# Patient Record
Sex: Female | Born: 1940 | Race: White | Hispanic: No | Smoking: Never smoker
Health system: Southern US, Community
[De-identification: ages and names within clinical notes are randomized; demographics above are authoritative.]

## PROBLEM LIST (undated history)

## (undated) DIAGNOSIS — M1990 Unspecified osteoarthritis, unspecified site: Secondary | ICD-10-CM

## (undated) DIAGNOSIS — E785 Hyperlipidemia, unspecified: Secondary | ICD-10-CM

## (undated) DIAGNOSIS — E236 Other disorders of pituitary gland: Secondary | ICD-10-CM

## (undated) DIAGNOSIS — I219 Acute myocardial infarction, unspecified: Secondary | ICD-10-CM

## (undated) DIAGNOSIS — I209 Angina pectoris, unspecified: Secondary | ICD-10-CM

## (undated) DIAGNOSIS — I10 Essential (primary) hypertension: Secondary | ICD-10-CM

## (undated) DIAGNOSIS — R0602 Shortness of breath: Secondary | ICD-10-CM

## (undated) DIAGNOSIS — I2510 Atherosclerotic heart disease of native coronary artery without angina pectoris: Secondary | ICD-10-CM

## (undated) DIAGNOSIS — M329 Systemic lupus erythematosus, unspecified: Secondary | ICD-10-CM

## (undated) DIAGNOSIS — K122 Cellulitis and abscess of mouth: Secondary | ICD-10-CM

## (undated) HISTORY — DX: Atherosclerotic heart disease of native coronary artery without angina pectoris: I25.10

## (undated) HISTORY — DX: Other disorders of pituitary gland: E23.6

## (undated) HISTORY — DX: Hyperlipidemia, unspecified: E78.5

## (undated) HISTORY — DX: Essential (primary) hypertension: I10

## (undated) HISTORY — DX: Systemic lupus erythematosus, unspecified: M32.9

---

## 1996-10-01 HISTORY — PX: CORONARY ARTERY BYPASS GRAFT: SHX141

## 1997-07-25 ENCOUNTER — Encounter: Admission: RE | Admit: 1997-07-25 | Discharge: 1997-07-25 | Payer: Self-pay | Admitting: Family Medicine

## 1997-08-01 ENCOUNTER — Ambulatory Visit (HOSPITAL_COMMUNITY): Admission: RE | Admit: 1997-08-01 | Discharge: 1997-08-01 | Payer: Self-pay | Admitting: Family Medicine

## 1997-09-15 ENCOUNTER — Ambulatory Visit (HOSPITAL_COMMUNITY): Admission: RE | Admit: 1997-09-15 | Discharge: 1997-09-15 | Payer: Self-pay | Admitting: Cardiology

## 1997-09-19 ENCOUNTER — Ambulatory Visit (HOSPITAL_COMMUNITY): Admission: RE | Admit: 1997-09-19 | Discharge: 1997-09-19 | Payer: Self-pay | Admitting: Cardiology

## 1997-10-03 ENCOUNTER — Encounter (HOSPITAL_COMMUNITY): Admission: RE | Admit: 1997-10-03 | Discharge: 1998-01-01 | Payer: Self-pay | Admitting: Cardiology

## 1998-01-17 ENCOUNTER — Encounter: Admission: RE | Admit: 1998-01-17 | Discharge: 1998-01-17 | Payer: Self-pay | Admitting: Family Medicine

## 1998-08-02 ENCOUNTER — Encounter: Admission: RE | Admit: 1998-08-02 | Discharge: 1998-08-02 | Payer: Self-pay | Admitting: Family Medicine

## 1998-08-16 ENCOUNTER — Ambulatory Visit (HOSPITAL_COMMUNITY): Admission: RE | Admit: 1998-08-16 | Discharge: 1998-08-16 | Payer: Self-pay | Admitting: Family Medicine

## 1998-08-26 ENCOUNTER — Encounter: Payer: Self-pay | Admitting: Family Medicine

## 1998-08-26 ENCOUNTER — Ambulatory Visit (HOSPITAL_COMMUNITY): Admission: RE | Admit: 1998-08-26 | Discharge: 1998-08-26 | Payer: Self-pay | Admitting: Family Medicine

## 1999-02-12 ENCOUNTER — Encounter: Admission: RE | Admit: 1999-02-12 | Discharge: 1999-02-12 | Payer: Self-pay | Admitting: Sports Medicine

## 1999-03-01 ENCOUNTER — Encounter: Admission: RE | Admit: 1999-03-01 | Discharge: 1999-03-01 | Payer: Self-pay | Admitting: Family Medicine

## 1999-04-11 ENCOUNTER — Encounter: Admission: RE | Admit: 1999-04-11 | Discharge: 1999-04-11 | Payer: Self-pay | Admitting: Family Medicine

## 1999-06-25 ENCOUNTER — Inpatient Hospital Stay (HOSPITAL_COMMUNITY): Admission: EM | Admit: 1999-06-25 | Discharge: 1999-06-27 | Payer: Self-pay | Admitting: Emergency Medicine

## 1999-06-25 ENCOUNTER — Encounter: Payer: Self-pay | Admitting: Emergency Medicine

## 1999-08-02 ENCOUNTER — Encounter: Payer: Self-pay | Admitting: Obstetrics and Gynecology

## 1999-08-02 ENCOUNTER — Encounter: Admission: RE | Admit: 1999-08-02 | Discharge: 1999-08-02 | Payer: Self-pay | Admitting: Obstetrics and Gynecology

## 1999-08-15 ENCOUNTER — Encounter: Admission: RE | Admit: 1999-08-15 | Discharge: 1999-08-15 | Payer: Self-pay | Admitting: Family Medicine

## 1999-09-17 ENCOUNTER — Encounter: Admission: RE | Admit: 1999-09-17 | Discharge: 1999-09-17 | Payer: Self-pay | Admitting: Family Medicine

## 1999-11-21 ENCOUNTER — Encounter: Admission: RE | Admit: 1999-11-21 | Discharge: 1999-11-21 | Payer: Self-pay | Admitting: Family Medicine

## 2000-09-24 ENCOUNTER — Encounter: Admission: RE | Admit: 2000-09-24 | Discharge: 2000-09-24 | Payer: Self-pay | Admitting: Family Medicine

## 2001-03-03 ENCOUNTER — Encounter (INDEPENDENT_AMBULATORY_CARE_PROVIDER_SITE_OTHER): Payer: Self-pay | Admitting: *Deleted

## 2001-03-03 LAB — CONVERTED CEMR LAB

## 2001-06-29 ENCOUNTER — Encounter: Admission: RE | Admit: 2001-06-29 | Discharge: 2001-06-29 | Payer: Self-pay | Admitting: Family Medicine

## 2001-09-28 ENCOUNTER — Encounter: Admission: RE | Admit: 2001-09-28 | Discharge: 2001-09-28 | Payer: Self-pay | Admitting: Family Medicine

## 2001-09-29 ENCOUNTER — Encounter: Admission: RE | Admit: 2001-09-29 | Discharge: 2001-09-29 | Payer: Self-pay | Admitting: Family Medicine

## 2001-10-04 ENCOUNTER — Encounter: Admission: RE | Admit: 2001-10-04 | Discharge: 2001-10-04 | Payer: Self-pay | Admitting: Family Medicine

## 2002-01-17 ENCOUNTER — Encounter (INDEPENDENT_AMBULATORY_CARE_PROVIDER_SITE_OTHER): Payer: Self-pay | Admitting: *Deleted

## 2002-01-17 ENCOUNTER — Ambulatory Visit (HOSPITAL_COMMUNITY): Admission: RE | Admit: 2002-01-17 | Discharge: 2002-01-17 | Payer: Self-pay | Admitting: Gastroenterology

## 2002-05-29 ENCOUNTER — Inpatient Hospital Stay (HOSPITAL_COMMUNITY): Admission: EM | Admit: 2002-05-29 | Discharge: 2002-05-31 | Payer: Self-pay

## 2002-05-29 ENCOUNTER — Encounter: Payer: Self-pay | Admitting: Emergency Medicine

## 2002-06-06 ENCOUNTER — Encounter: Admission: RE | Admit: 2002-06-06 | Discharge: 2002-09-04 | Payer: Self-pay | Admitting: Cardiology

## 2002-06-20 ENCOUNTER — Encounter: Payer: Self-pay | Admitting: Cardiology

## 2002-06-20 ENCOUNTER — Inpatient Hospital Stay (HOSPITAL_COMMUNITY): Admission: AD | Admit: 2002-06-20 | Discharge: 2002-06-21 | Payer: Self-pay | Admitting: Cardiology

## 2002-09-06 ENCOUNTER — Encounter: Admission: RE | Admit: 2002-09-06 | Discharge: 2002-12-05 | Payer: Self-pay | Admitting: Cardiology

## 2002-11-18 ENCOUNTER — Ambulatory Visit (HOSPITAL_COMMUNITY): Admission: RE | Admit: 2002-11-18 | Discharge: 2002-11-19 | Payer: Self-pay | Admitting: Cardiology

## 2002-11-18 ENCOUNTER — Encounter: Payer: Self-pay | Admitting: Cardiology

## 2003-02-20 ENCOUNTER — Ambulatory Visit (HOSPITAL_COMMUNITY): Admission: RE | Admit: 2003-02-20 | Discharge: 2003-02-20 | Payer: Self-pay | Admitting: Cardiology

## 2004-05-30 ENCOUNTER — Ambulatory Visit: Payer: Self-pay | Admitting: Family Medicine

## 2004-05-31 ENCOUNTER — Ambulatory Visit: Payer: Self-pay | Admitting: Sports Medicine

## 2004-06-05 ENCOUNTER — Ambulatory Visit (HOSPITAL_COMMUNITY): Admission: RE | Admit: 2004-06-05 | Discharge: 2004-06-05 | Payer: Self-pay | Admitting: Cardiology

## 2004-06-27 ENCOUNTER — Ambulatory Visit: Payer: Self-pay

## 2004-09-26 ENCOUNTER — Ambulatory Visit: Payer: Self-pay | Admitting: Family Medicine

## 2005-01-28 ENCOUNTER — Ambulatory Visit: Payer: Self-pay | Admitting: Family Medicine

## 2005-05-15 ENCOUNTER — Ambulatory Visit (HOSPITAL_COMMUNITY): Admission: RE | Admit: 2005-05-15 | Discharge: 2005-05-15 | Payer: Self-pay | Admitting: Family Medicine

## 2005-05-19 ENCOUNTER — Encounter: Admission: RE | Admit: 2005-05-19 | Discharge: 2005-08-17 | Payer: Self-pay | Admitting: Family Medicine

## 2005-06-30 ENCOUNTER — Ambulatory Visit: Payer: Self-pay | Admitting: Internal Medicine

## 2005-06-30 ENCOUNTER — Emergency Department (HOSPITAL_COMMUNITY): Admission: EM | Admit: 2005-06-30 | Discharge: 2005-06-30 | Payer: Self-pay | Admitting: Emergency Medicine

## 2005-06-30 ENCOUNTER — Ambulatory Visit: Payer: Self-pay | Admitting: Family Medicine

## 2005-06-30 ENCOUNTER — Inpatient Hospital Stay (HOSPITAL_COMMUNITY): Admission: AD | Admit: 2005-06-30 | Discharge: 2005-07-03 | Payer: Self-pay | Admitting: Oral Surgery

## 2005-07-01 ENCOUNTER — Encounter: Payer: Self-pay | Admitting: Vascular Surgery

## 2006-03-03 DIAGNOSIS — K122 Cellulitis and abscess of mouth: Secondary | ICD-10-CM

## 2006-03-03 HISTORY — DX: Cellulitis and abscess of mouth: K12.2

## 2006-04-30 DIAGNOSIS — I2510 Atherosclerotic heart disease of native coronary artery without angina pectoris: Secondary | ICD-10-CM | POA: Insufficient documentation

## 2006-04-30 DIAGNOSIS — F329 Major depressive disorder, single episode, unspecified: Secondary | ICD-10-CM

## 2006-04-30 DIAGNOSIS — M329 Systemic lupus erythematosus, unspecified: Secondary | ICD-10-CM

## 2006-04-30 DIAGNOSIS — E119 Type 2 diabetes mellitus without complications: Secondary | ICD-10-CM

## 2006-05-01 ENCOUNTER — Encounter (INDEPENDENT_AMBULATORY_CARE_PROVIDER_SITE_OTHER): Payer: Self-pay | Admitting: *Deleted

## 2006-07-01 ENCOUNTER — Ambulatory Visit (HOSPITAL_COMMUNITY): Admission: RE | Admit: 2006-07-01 | Discharge: 2006-07-01 | Payer: Self-pay | Admitting: Cardiovascular Disease

## 2006-08-06 ENCOUNTER — Ambulatory Visit (HOSPITAL_COMMUNITY): Admission: RE | Admit: 2006-08-06 | Discharge: 2006-08-06 | Payer: Self-pay | Admitting: Obstetrics and Gynecology

## 2006-08-20 ENCOUNTER — Ambulatory Visit: Payer: Self-pay | Admitting: Family Medicine

## 2006-08-20 DIAGNOSIS — E785 Hyperlipidemia, unspecified: Secondary | ICD-10-CM | POA: Insufficient documentation

## 2006-08-20 DIAGNOSIS — I10 Essential (primary) hypertension: Secondary | ICD-10-CM

## 2006-08-20 LAB — CONVERTED CEMR LAB
ALT: 18 units/L (ref 0–35)
AST: 18 units/L (ref 0–37)
Albumin: 4.8 g/dL (ref 3.5–5.2)
Alkaline Phosphatase: 69 units/L (ref 39–117)
BUN: 17 mg/dL (ref 6–23)
Cholesterol: 131 mg/dL (ref 0–200)
Creatinine, Ser: 0.64 mg/dL (ref 0.40–1.20)
HCT: 42.7 %
HDL: 44 mg/dL (ref >39)
MCV: 89.8 fL
Potassium: 4 meq/L (ref 3.5–5.3)
Sodium: 143 meq/L (ref 135–145)
Total Bilirubin: 1.6 mg/dL — ABNORMAL HIGH (ref 0.3–1.2)
Triglycerides: 108 mg/dL (ref <150)

## 2006-08-25 ENCOUNTER — Encounter: Payer: Self-pay | Admitting: Family Medicine

## 2006-09-19 ENCOUNTER — Emergency Department (HOSPITAL_COMMUNITY): Admission: EM | Admit: 2006-09-19 | Discharge: 2006-09-20 | Payer: Self-pay | Admitting: Emergency Medicine

## 2006-11-09 ENCOUNTER — Emergency Department (HOSPITAL_COMMUNITY): Admission: EM | Admit: 2006-11-09 | Discharge: 2006-11-09 | Payer: Self-pay | Admitting: Emergency Medicine

## 2007-04-08 ENCOUNTER — Ambulatory Visit: Payer: Self-pay | Admitting: Family Medicine

## 2007-04-08 LAB — CONVERTED CEMR LAB
ALT: 22 units/L (ref 0–35)
AST: 18 units/L (ref 0–37)
Alkaline Phosphatase: 79 units/L (ref 39–117)
CO2: 22 meq/L (ref 19–32)
Chloride: 104 meq/L (ref 96–112)
Creatinine, Ser: 0.6 mg/dL (ref 0.40–1.20)
Glucose, Bld: 116 mg/dL — ABNORMAL HIGH (ref 70–99)
HCT: 44.4 % (ref 36.0–46.0)
Hemoglobin: 14.9 g/dL (ref 12.0–15.0)
Hgb A1c MFr Bld: 6.6 %
MCHC: 33.6 g/dL (ref 30.0–36.0)
MCV: 89.2 fL (ref 78.0–100.0)
Platelets: 262 10*3/uL (ref 150–400)
Total Bilirubin: 1.9 mg/dL — ABNORMAL HIGH (ref 0.3–1.2)

## 2007-04-09 ENCOUNTER — Encounter: Payer: Self-pay | Admitting: Family Medicine

## 2007-08-20 ENCOUNTER — Encounter: Payer: Self-pay | Admitting: Family Medicine

## 2007-10-14 ENCOUNTER — Ambulatory Visit: Payer: Self-pay | Admitting: Family Medicine

## 2008-06-07 ENCOUNTER — Ambulatory Visit (HOSPITAL_COMMUNITY): Admission: RE | Admit: 2008-06-07 | Discharge: 2008-06-07 | Payer: Self-pay | Admitting: Obstetrics & Gynecology

## 2008-06-07 ENCOUNTER — Ambulatory Visit: Payer: Self-pay | Admitting: Family Medicine

## 2008-06-07 ENCOUNTER — Encounter: Payer: Self-pay | Admitting: Family Medicine

## 2008-06-07 LAB — CONVERTED CEMR LAB
ALT: 20 units/L (ref 0–35)
Alkaline Phosphatase: 74 units/L (ref 39–117)
Cholesterol: 125 mg/dL (ref 0–200)
Glucose, Bld: 133 mg/dL — ABNORMAL HIGH (ref 70–99)
HDL: 48 mg/dL (ref >39)
LDL Cholesterol: 57 mg/dL (ref 0–99)
Sodium: 145 meq/L (ref 135–145)
Total CHOL/HDL Ratio: 2.6

## 2008-11-08 ENCOUNTER — Encounter: Payer: Self-pay | Admitting: Family Medicine

## 2008-11-08 LAB — CONVERTED CEMR LAB
AST: 29 units/L
Alkaline Phosphatase: 90 units/L

## 2008-11-29 ENCOUNTER — Encounter: Payer: Self-pay | Admitting: Family Medicine

## 2008-11-30 ENCOUNTER — Ambulatory Visit: Payer: Self-pay | Admitting: Family Medicine

## 2008-11-30 LAB — CONVERTED CEMR LAB: Hgb A1c MFr Bld: 6.4 %

## 2008-12-04 ENCOUNTER — Encounter: Admission: RE | Admit: 2008-12-04 | Discharge: 2008-12-04 | Payer: Self-pay | Admitting: Family Medicine

## 2008-12-04 ENCOUNTER — Encounter: Payer: Self-pay | Admitting: Family Medicine

## 2009-06-07 ENCOUNTER — Telehealth: Payer: Self-pay | Admitting: Family Medicine

## 2009-07-31 ENCOUNTER — Ambulatory Visit (HOSPITAL_COMMUNITY): Admission: RE | Admit: 2009-07-31 | Discharge: 2009-07-31 | Payer: Self-pay | Admitting: Obstetrics & Gynecology

## 2009-08-15 ENCOUNTER — Ambulatory Visit: Payer: Self-pay | Admitting: Family Medicine

## 2009-10-22 ENCOUNTER — Encounter: Payer: Self-pay | Admitting: Family Medicine

## 2009-12-17 ENCOUNTER — Ambulatory Visit: Payer: Self-pay | Admitting: Cardiology

## 2009-12-17 ENCOUNTER — Encounter: Admission: RE | Admit: 2009-12-17 | Discharge: 2009-12-17 | Payer: Self-pay | Admitting: Cardiology

## 2009-12-18 ENCOUNTER — Ambulatory Visit: Payer: Self-pay | Admitting: Cardiology

## 2009-12-18 ENCOUNTER — Inpatient Hospital Stay (HOSPITAL_COMMUNITY): Admission: RE | Admit: 2009-12-18 | Discharge: 2009-12-20 | Payer: Self-pay | Admitting: Cardiology

## 2009-12-21 ENCOUNTER — Observation Stay (HOSPITAL_COMMUNITY): Admission: EM | Admit: 2009-12-21 | Discharge: 2009-12-22 | Payer: Self-pay | Admitting: Emergency Medicine

## 2009-12-21 ENCOUNTER — Encounter: Payer: Self-pay | Admitting: Cardiology

## 2009-12-21 ENCOUNTER — Ambulatory Visit: Payer: Self-pay | Admitting: Cardiovascular Disease

## 2009-12-21 ENCOUNTER — Telehealth: Payer: Self-pay | Admitting: Cardiovascular Disease

## 2010-01-07 ENCOUNTER — Ambulatory Visit: Payer: Self-pay | Admitting: Cardiology

## 2010-01-30 ENCOUNTER — Encounter (INDEPENDENT_AMBULATORY_CARE_PROVIDER_SITE_OTHER): Payer: Self-pay | Admitting: *Deleted

## 2010-04-02 NOTE — Assessment & Plan Note (Signed)
Summary: not feeling well,df   History of Present Illness: 70 yo female here because 1-2 days a week she feels low energy.  No interest in doing things those days.  Retired 1 year ago.  Has been feeling this way about 1 year.    Denies sadness.   Planss to go on HCG diet - has done in the past, and had blood work done for that.  Showed elevated thyroid antibodies and hbgA1c of 6.8.   Trouble sleeping, trouble concentrating, low energy, sits around house and watches TV on her bad days.  Takes  Paxil, unsure if this helps.  Denies SI/HI.  Has supportive BF, no violence.  He is 17 years younger than she is, and she is afraid she won't be "able to keep up with him".  Significant stressors: Pt raised her grandsons (now 24 and 34) because her daughter was a drug user.  Daughter is now dying, and pt has been in touch with her again and other family members are not supportive of this. 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  Pt lives part time in [place].  Not really established in primary care there, but did have a doctor who put her on the hcg diet.      Not on meds for diabetes.  Was told she had borderline diabetes 3 years ago, recently told full blown.   Feels unsure about what to do for diabetes. Interested in meeting with diabetic educator, but has to do so within 2 days because she has to go back to [place].  Current Medications (verified): 1)  Metoprolol Tartrate 50 Mg Tabs (Metoprolol Tartrate) .... Take 1/2 Tab By Mouth Two Times A Day 2)  Norvasc 10 Mg Tabs (Amlodipine Besylate) .Marland Kitchen.. 1 Daily 3)  Zetia 10 Mg Tabs (Ezetimibe) .Marland Kitchen.. 1 Once Daily 4)  Simvastatin 40 Mg Tabs (Simvastatin) .... Take One Tablet At Bedtime 5)  Plavix 75 Mg Tabs (Clopidogrel Bisulfate) .... Take 1 Tab By Mouth Daily 6)  Paxil 20 Mg Tabs (Paroxetine Hcl) .Marland Kitchen.. 1 By Mouth Once Daily 7)  Multivitamins   Tabs (Multiple Vitamin) 8)  Fish Oil 1000 Mg Caps (Omega-3 Fatty Acids) .Marland Kitchen.. 1 By Mouth Daily 9)  Aspirin 325 Mg Tabs (Aspirin) .Marland Kitchen.. 1 By Mouth Daily 10)  Caltrate 600+d Plus 600-400 Mg-Unit Tabs (Calcium Carbonate-Vit D-Min) 11)  Glucosamine 500 Mg Caps (Glucosamine Sulfate) 12)  Paxil 40 Mg Tabs (Paroxetine Hcl) .Marland Kitchen.. 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  Her daughter (their mother) is dying.   Has boyfriend.  No DV Sister Francella Solian  Review of Systems       see HPI  Physical Exam  General:  Obese, subdued.  No acute distress.  Vitals noted. Psych:  Flat affect.  Normal grooming and dress.  Normal TC/TP without FOI or LOA.  Non labile.     Impression & Recommendations:  Problem # 1:  DEPRESSIVE DISORDER, NOS (ICD-311)  Pt denies SI/HI.  SItuation complicated by significant stress and life changes.  Pt would likely benefit from cognitive behavioral therapy, but she not interested in trying to get established here because she spends  most of her time in [place].  Will increase paroxetine to 40 daily.   Pt ed doen on meds and depressive d/o.  She contracts for safety. Her updated medication list for this problem includes:    Paxil 20 Mg Tabs (Paroxetine hcl) .Marland Kitchen... 1 by mouth once daily    Paxil 40 Mg Tabs (Paroxetine hcl) .Marland Kitchen... 1 daily by mouth for depression  Orders: [facility]- Est Level  3 ([address])  Problem # 2:  DIABETES MELLITUS II, UNCOMPLICATED (ICD-250.00) Pt could benefit from diabetic education, but unable to see someone here because of time constraints.  Strongly encouaraged pt to get established with primary care in [place] asap to have someone managing diabetes.  Brief education done today.   Her updated medication list for this  problem includes:    Aspirin 325 Mg Tabs (Aspirin) .Marland Kitchen... 1 by mouth daily  Orders: Glucose-[facility] (60454-09811) [facility]- Est Level  3 (91478)  Complete Medication List: 1)  Metoprolol Tartrate 50 Mg Tabs (Metoprolol tartrate) .... Take 1/2 tab by mouth two times a day 2)  Norvasc 10 Mg Tabs (Amlodipine besylate) .Marland Kitchen.. 1 daily 3)  Zetia 10 Mg Tabs (Ezetimibe) .Marland Kitchen.. 1 once daily 4)  Simvastatin 40 Mg Tabs (Simvastatin) .... Take one tablet at bedtime 5)  Plavix 75 Mg Tabs (Clopidogrel bisulfate) .... Take 1 tab by mouth daily 6)  Paxil 20 Mg Tabs (Paroxetine hcl) .Marland Kitchen.. 1 by mouth once daily 7)  Multivitamins Tabs (Multiple vitamin) 8)  Fish Oil 1000 Mg Caps (Omega-3 fatty acids) .Marland Kitchen.. 1 by mouth daily 9)  Aspirin 325 Mg Tabs (Aspirin) .Marland Kitchen.. 1 by mouth daily 10)  Caltrate 600+d Plus 600-400 Mg-unit Tabs (Calcium carbonate-vit d-min) 11)  Glucosamine 500 Mg Caps (Glucosamine sulfate) 12)  Paxil 40 Mg Tabs (Paroxetine hcl) .Marland Kitchen.. 1 daily by mouth for depression  Patient Instructions: 1)  Take 2 of the 20 mg Paxil tablets that you have at home until you run out.  Then, you can start the new prescription of 40 mg a day. 2)  Try to get hooked in with a therapist in [place].  I would suggest cognitive behavioral therapy. 3)  You can try to meet with the diabetic educator before you go back, or you can meet with one in [place].   4)  Please come see me or Dr. Deirdre Priest when you are back in town, but you should probably get established with a primary care doctor in [place] if you plan to stay there.   Prescriptions: PAXIL 40 MG TABS (PAROXETINE HCL) 1 daily by mouth for depression  #90 x 0   Entered and Authorized by:   Kynadie Yaun [place] MD   Signed by:   Lazer Wollard [place] MD on 08/15/2009   Method used:   Electronically to        [facility] [address] (retail)       [phone]       [place], [place]  [address]       Ph: [phone]       Fax: [phone]   RxID:    [id number]

## 2010-04-02 NOTE — Miscellaneous (Signed)
Summary: med record request  Clinical Lists Changes  Rec'd med record request from [facility] center faxed 09/24/09 Marily Memos  January 30, 2010 3:16 PM

## 2010-04-02 NOTE — Progress Notes (Signed)
Summary: phn msg  Phone Note Call from Patient Call back at [phone] or [phone]   Caller: Patient Reason for Call: Talk to Nurse Summary of Call: pt wants to speak with RN about going on the HCG diet - taking a low dose shot of HCG for 6 weeks - it is a medical doctor that will be doing this but needs an OK from Dr C Initial call taken by: Knox Royalty,  June 07, 2009 12:10 PM  Follow-up for Phone Call         lm on other number that I was sending this to pcp & will call her with his response Follow-up by: Golden Circle RN,  June 07, 2009 12:13 PM  Additional Follow-up for Phone Call Additional follow up Details #1::        pt is calling again Additional Follow-up by: De Nurse,  June 08, 2009 1:40 PM    Additional Follow-up for Phone Call Additional follow up Details #2::    pt will check with doctor and get info to send to Dr C Follow-up by: De Nurse,  June 08, 2009 3:20 PM  Additional Follow-up for Phone Call Additional follow up Details #3:: Details for Additional Follow-up Action Taken: Angelique Blonder spoke with her and she will contact us with information that I will review  Additional Follow-up by: Pearlean Brownie MD,  June 08, 2009 5:05 PM

## 2010-04-02 NOTE — Letter (Signed)
Summary: ER Notification  Architectural technologist, Main Office  [address]   [place], [place] [address]   Phone: [phone]  Fax: [phone]    December 21, 2009 12:47 PM  Casey [name]  The above referenced patient has been advised to report directly to the Emergency Room. Please see below for more information:  Dx: ___chest pain_________     Private Vehicle  ______X_________ or EMS:  ________________   Orders:  Yes ______ or No  ___X____   Notify upon arrival:    Trish [phone]       Or _________________   Thank you,    [facility] Staff

## 2010-04-02 NOTE — Consult Note (Signed)
Summary: [facility]  [facility]   Imported By: Clydell Hakim 10/24/2009 10:54:26  _____________________________________________________________________  External Attachment:    Type:   Image     Comment:   External Document

## 2010-04-02 NOTE — Progress Notes (Signed)
Summary: chest pain no sob  Phone Note Call from Patient   Caller: Patient Reason for Call: Talk to Nurse Summary of Call: pt has chest pain. for . Initial call taken by: Roe Coombs,  December 21, 2009 12:35 PM  Follow-up for Phone Call        pt states about ago she had a bowel movement and strained since that time she has been having chest pain, she describes it as a dull constant pain, she states its the same pain she had before having stent placed this week.  No sob, no n/v, non radiating.  She has been resting and it is not getting better, advised to go to ER she is agreeable, she states someone is w/her to driver her, Rosann Auerbach is aware Meredith Staggers, RN  December 21, 2009 12:47 PM

## 2010-05-15 LAB — CBC
HCT: 38.8 % (ref 36.0–46.0)
HCT: 40 % (ref 36.0–46.0)
HCT: 41.3 % (ref 36.0–46.0)
MCHC: 33.7 g/dL (ref 30.0–36.0)
MCHC: 34.3 g/dL (ref 30.0–36.0)
MCV: 89.3 fL (ref 78.0–100.0)
MCV: 89.6 fL (ref 78.0–100.0)
MCV: 89.6 fL (ref 78.0–100.0)
Platelets: 208 10*3/uL (ref 150–400)
RDW: 13.1 % (ref 11.5–15.5)
RDW: 13.2 % (ref 11.5–15.5)
WBC: 6.9 10*3/uL (ref 4.0–10.5)
WBC: 6.9 10*3/uL (ref 4.0–10.5)
WBC: 9.4 10*3/uL (ref 4.0–10.5)

## 2010-05-15 LAB — POCT I-STAT, CHEM 8
BUN: 9 mg/dL (ref 6–23)
Glucose, Bld: 181 mg/dL — ABNORMAL HIGH (ref 70–99)
Hemoglobin: 14.3 g/dL (ref 12.0–15.0)
Potassium: 3.9 mEq/L (ref 3.5–5.1)
Sodium: 139 mEq/L (ref 135–145)

## 2010-05-15 LAB — BASIC METABOLIC PANEL
BUN: 8 mg/dL (ref 6–23)
CO2: 25 mEq/L (ref 19–32)
Calcium: 8.5 mg/dL (ref 8.4–10.5)
Creatinine, Ser: 0.61 mg/dL (ref 0.4–1.2)
GFR calc Af Amer: >60 mL/min (ref >60)
GFR calc non Af Amer: >60 mL/min (ref >60)
Potassium: 3.6 mEq/L (ref 3.5–5.1)
Sodium: 137 mEq/L (ref 135–145)

## 2010-05-15 LAB — CARDIAC PANEL(CRET KIN+CKTOT+MB+TROPI)
CK, MB: 0.5 ng/mL (ref 0.3–4.0)
Relative Index: INVALID (ref 0.0–2.5)
Relative Index: INVALID (ref 0.0–2.5)
Total CK: 39 U/L (ref 7–177)
Total CK: 44 U/L (ref 7–177)

## 2010-05-15 LAB — POCT CARDIAC MARKERS
CKMB, poc: <1 ng/mL — ABNORMAL LOW (ref 1.0–8.0)
Myoglobin, poc: 26.9 ng/mL (ref 12–200)

## 2010-05-15 LAB — DIFFERENTIAL
Basophils Absolute: 0 10*3/uL (ref 0.0–0.1)
Basophils Relative: 0 % (ref 0–1)
Eosinophils Relative: 1 % (ref 0–5)
Monocytes Absolute: 0.9 10*3/uL (ref 0.1–1.0)
Neutro Abs: 4.2 10*3/uL (ref 1.7–7.7)

## 2010-05-15 LAB — GLUCOSE, CAPILLARY
Glucose-Capillary: 131 mg/dL — ABNORMAL HIGH (ref 70–99)
Glucose-Capillary: 150 mg/dL — ABNORMAL HIGH (ref 70–99)
Glucose-Capillary: 151 mg/dL — ABNORMAL HIGH (ref 70–99)

## 2010-07-16 NOTE — Discharge Summary (Signed)
Casey [name], WAXMAN NO.:  [id number]   MEDICAL RECORD NO.:  [id number]          PATIENT TYPE:  OUT   LOCATION:  [facility]                          FACILITY:  [facility]   PHYSICIAN:  Grant Ruts., D.D.S.DATE OF BIRTH:  01-17-1941   DATE OF ADMISSION:  08/06/2006  DATE OF DISCHARGE:  08/06/2006                               DISCHARGE SUMMARY   DISCHARGE NOTE AND SUMMARY:  This 70 year old female was admitted to  [facility] on June 03, 2005 with primary diagnosis of left  submandibular space abscess and cellulitis secondary to dental implant  surgery, and arteriosclerotic cardiovascular disease, hypertension, and  diabetes.  On the day of admission, she was started on intravenous  antibiotics with Levaquin 750 mg daily.  Consultation was received from  Infectious Disease, and medical consultation from Dr. Tawanna Cooler McDiarmid.  The left submandibular space had been drained and a Penrose drain had  been placed extraorally, and cultures were taken for routine cultures  and sensitivity.   HOSPITAL COURSE:  It was noted that CT scan revealed that she had a left  submandibular sialoadenitis, and blockage of the left submandibular  gland, that was the source of the infection, and not the dental  implants.  Consultation was received from ENT, and confirmed the  sialoadenitis and secondary submandibular abscess.  She initially was  changed to Zosyn, and responded well to the antibiotic.  She initially  was placed on vancomycin, but this was discontinued.  Her abscess  responded well to antibiotic treatment, and she was changed to oral  Augmentin 875 mg twice daily.  Her mouth swelling and drainage has  stopped, and she was taking food well.  After control of the abscess,  she was discharged to follow as an outpatient for her dental implants.  She will be followed by her family physician for her cardiovascular  disease and hypertension.  She will see Dr. Pollyann Kennedy for followup of the  stone in the left submandibular gland.   FINAL DIAGNOSES:  1. Left submandibular space abscess and cellulitis secondary to      blockage of left submandibular gland with submandibular stone.  2. Chronic cardiovascular disease.  3. Hypertension.  4. Diabetes.      Grant Ruts., D.D.S.  Electronically Signed     WB/MEDQ  D:  09/15/2006  T:  09/15/2006  Job:  [id number]

## 2010-07-19 NOTE — Cardiovascular Report (Signed)
NAMEVALINDA, [name] NO.:  [id number]   MEDICAL RECORD NO.:  [id number]          PATIENT TYPE:  OIB   LOCATION:  2866                         FACILITY:  [facility]   PHYSICIAN:  Colleen Can. Deborah Chalk, M.D.DATE OF BIRTH:  08-16-1940   DATE OF PROCEDURE:  06/05/2004  DATE OF DISCHARGE:                              CARDIAC CATHETERIZATION   HISTORY:  [name] is a 70 year old female with previous coronary artery  bypass grafting.  She is referred for catheterization because of recurrent  chest pain.   PROCEDURES:  Left heart catheterization with selective coronary angiography,  left ventricular angiography, saphenous vein graft angiography x 1 and  angiography of left internal mammary artery.   TYPE AND SITE OF ENTRY:  Percutaneous right femoral artery with Angio-Seal.   CATHETERS:  6 French 4 curved Judkins right and left coronary catheters, 6  French pigtail ventriculographic catheter and left internal mammary graft  catheter.   CONTRAST:  Omnipaque.   MEDICATIONS GIVEN PRIOR TO PROCEDURE:  Valium 10 mg p.o.   MEDICATIONS GIVEN DURING PROCEDURE:  Versed 5 mg IV.   COMMENTS:  The patient tolerated the procedure well.   HEMODYNAMIC DATA:  The aortic pressure was 115/61.  LV was 139/4-11.  There  was no aortic valve gradient noted on pullback.   ANGIOGRAPHIC DATA:  1.  The left main coronary artery has a distal 60% narrowing.  2.  The left anterior descending has irregularities and narrowing proximally      that continue out of the left main stenosis.  Overall does not appear to      be significantly greater than a moderate narrowing proximal in the LAD.      Flow into the branches is satisfactory.  There is a left internal      mammary artery graft providing bidirectional flow distally in the left      anterior descending.  3.  Left circumflex:  The left circumflex has stenosis proximally, but it      has bidirectional flow.  There is excellent flow  distally from a      saphenous vein graft.  4.  Intermediate coronary artery:  The intermediate coronary artery is      basically normal.  5.  Right coronary artery:  The right coronary artery has patent stents in      its proximal segment.  One stented area has a 20-30% narrowing.  There      is 50% ostial posterior descending artery narrowing.   The saphenous vein graft to the obtuse marginal has 20-40% narrowing in the  mid portion of the vessel.  The saphenous vein graft does have early  atherosclerotic disease.   The left internal mammary artery graft to the LAD is patent and is  relatively small, but does have bidirectional flow distally.   The left ventriculogram was performed in the RAO position.  Overall cardiac  size and silhouette are normal.  The global ejection fraction is 60%.  Regional wall motion is normal.   OVERALL IMPRESSION:  1.  Essentially normal left ventricular function.  2.  Patent stents  in the right coronary artery.  3.  Distal left main coronary artery stenosis.  4.  Patent saphenous vein graft to the left circumflex system with a patent      left internal mammary artery graft to the left anterior descending.   DISCUSSION:  These findings suggest that Casey [name] is stable at this point in  time.  We will again encourage her to continue to modify cardiac risk  factors.      SNT/MEDQ  D:  06/05/2004  T:  06/05/2004  Job:  [id number]

## 2010-07-19 NOTE — Cardiovascular Report (Signed)
[name], [name] NO.:  [id number]   MEDICAL RECORD NO.:  [id number]          PATIENT TYPE:  OIB   LOCATION:  2858                         FACILITY:  [facility]   PHYSICIAN:  Vesta Mixer, M.D. DATE OF BIRTH:  Sep 09, 1940   DATE OF PROCEDURE:  07/01/2006  DATE OF DISCHARGE:                            CARDIAC CATHETERIZATION   Casey [name] is a 70 year old female with a history of coronary  artery disease and coronary artery bypass grafting.  She is referred for  heart catheterization after having significant episodes of chest pain  and chest discomfort.   PROCEDURES PERFORMED:  Left heart catheterization with coronary  angiography.   The right femoral artery was easily cannulated using modified Seldinger  technique.   HEMODYNAMIC RESULTS:  LV pressure is 121/11 with an aortic pressure of  117/75.   ANGIOGRAPHY:  The left main has minor irregularities in the proximal  segment.  There is a 50-60% stenosis in the distal aspect of the left  main.   The left anterior descending artery in the proximal segment has moderate  irregularities of about 30%.  The mid LAD has some minor luminal  irregularities.  The distal LAD is fairly small and appears to have  about a 60-70% stenosis after giving off a second diagonal vessel.  The  diagonal vessels are moderate in size and have only minor luminal  irregularities.  There is competitive flow visible from the IMA.   The left circumflex artery has a long proximal stenosis of 70-80% prior  to giving off the first OM.  The remainder of the circumflex artery has  minor luminal irregularities.  The first obtuse marginal artery can be  seen filling with competitive flow from the graft.   The right coronary artery is large and dominant.  The proximal stent is  normal.  This is followed by a mid 30% stenosis.  There is also a distal  30-40% stenosis just prior to the distal stent.   The posterior descending artery has  a 30-40% stenosis at the takeoff.   The saphenous vein graft to the obtuse marginal artery is a large graft.  There is a long eccentric 40-50% stenosis in the mid segment of the  graft.  The anastomosis to the OM1 is normal.   The left internal mammary artery is a relatively small vessel.  The LAD  is fairly small and it appears that the left internal mammary artery may  be becoming somewhat atretic because of the good flow from the native  left main and LAD.  There are no discrete stenoses.   The left ventriculogram was performed in the 30 RAO position.  It  reveals overall normal left ventricular systolic function.  Ejection  fraction is 65%.  There are no segmental wall motion abnormalities.   COMPLICATIONS:  None.   CONCLUSIONS:  1. Severe native coronary artery disease with left main stenosis and      left circumflex stenosis.  2. Patent internal mammary artery and patent saphenous vein grafts.      The left internal mammary artery may be becoming atretic, but  it is      because the flow down the native left anterior descending is fairly      well preserved.  She has normal left ventricular systolic function.      We will continue with medical therapy.           ______________________________  Vesta Mixer, M.D.     PJN/MEDQ  D:  07/01/2006  T:  07/01/2006  Job:  [id number]   cc:   Gwen Pounds, MD  Colleen Can Deborah Chalk, M.D.

## 2010-07-19 NOTE — H&P (Signed)
NAME:  Casey [name], Casey [name] NO.:  [id number]   MEDICAL RECORD NO.:  [id number]                   PATIENT TYPE:  OIB   LOCATION:                                       FACILITY:  [facility]   PHYSICIAN:  Colleen Can. Deborah Chalk, M.D.            DATE OF BIRTH:  11/06/1940   DATE OF ADMISSION:  02/20/2003  DATE OF DISCHARGE:                                HISTORY & PHYSICAL   CHIEF COMPLAINT:  Recurrent angina.   HISTORY OF PRESENT ILLNESS:  Casey [name] is a 70 year old white female who  has multiple medical problems.  She presented to the office on February 17, 2003, as a work in with complaints of chest pressure over the last several  days.  It basically occurs at about 4:00 to 5:00 each evening.  It is  relieved with rest.  It is identical to her previous chest pain syndrome.  She has had no other associated symptoms such as nausea, vomiting, or  diaphoresis.  When she is able to go home and relax and go to sleep she  wakes up without complaints.  She is now referred for elective cardiac  catheterization.   PAST MEDICAL HISTORY:  1. Atherosclerotic cardiovascular disease.  Previous history of an anterior     MI dating back to 1992, with prior angioplasty to the LAD with subsequent     coronary artery bypass grafting x2 in 1998, with left internal mammary to     the LAD and reverse saphenous vein graft to the OM.  She has had previous     stent placement to the right coronary in March 2004.  Her last     catheterization was in September 2004, with subsequent stenting x2 to the     right coronary artery.  2. Obesity.  3. Diabetes.  4. Hypertension.  5. Hyperlipidemia.  6. Chronic anxiety and depression.   ALLERGIES:  No known drug allergies.   CURRENT MEDICATIONS:  1. Zocor 40 q. day.  2. Lopressor 25 b.i.d.  3. Aspirin daily.  4. Zoloft 50 b.i.d.  5. Plavix 75 mg a day.  6. Protonix 40 mg a day.  7. Norvasc 2.5 b.i.d.  8. Caltrate daily.  9.  Multivitamin daily.  10.      Vitamin E daily.  11.      Glucosamine two tablets a day.  12.      Zetia 10 mg a day.  13.      Recently started on Imdur 30 mg 1/2 tablet daily.   FAMILY HISTORY:  Unchanged.   SOCIAL HISTORY:  Unchanged.   REVIEW OF SYSTEMS:  As noted above, and is otherwise unremarkable.   PHYSICAL EXAMINATION:  GENERAL:  She is currently in no acute distress.  She  is pain-free when seen in the office.  VITAL SIGNS:  Blood pressure is 130/70 sitting, 130/80 standing, heart rate  is 84 and regular,  respirations 18, she is afebrile.  SKIN:  Warm and dry.  Color is unremarkable.  HEENT:  Unremarkable.  NECK:  Supple without bruits.  LUNGS:  Basically clear.  HEART:  Regular rhythm.  ABDOMEN:  Somewhat protuberant, yet soft, positive bowel sounds, no masses.  EXTREMITIES:  Without edema.  NEUROLOGIC:  Intact.  There are no gross focal deficits.   LABORATORY DATA:  Pertinent labs are pending.   OVERALL IMPRESSION:  1. Recurrent episodes of angina relieved with rest.  2. Known coronary disease, previous bypass grafting with recent     catheterization and stent placement to the right coronary artery in     September 2004.  3. Diabetes.  4. Hypertension.  5. Anxiety and depression.  6. Obesity.   PLAN:  We will proceed on with repeat cardiac catheterization.  The patient  was placed on Imdur over the course of the weekend, as well as told to  curtail all activities, as well as to refrain from working.  She is to call  if any problems occur in the interim or proceed on to the emergency  department.  Otherwise, we will plan on repeat cardiac catheterization on  Monday, February 20, 2003.      Casey [name], N.P.                 Colleen Can. Deborah Chalk, M.D.    LCO/MEDQ  D:  02/17/2003  T:  02/18/2003  Job:  [id number]   cc:   Gwen Pounds, M.D.  [address]  [place]  [place] [address]  Fax: [phone]

## 2010-07-19 NOTE — Cardiovascular Report (Signed)
NAME:  Casey [name], Casey [name] NO.:  [id number]   MEDICAL RECORD NO.:  [id number]                   PATIENT TYPE:  OIB   LOCATION:  6533                                 FACILITY:  [facility]   PHYSICIAN:  Colleen Can. Deborah Chalk, M.D.            DATE OF BIRTH:  04-05-1940   DATE OF PROCEDURE:  11/18/2002  DATE OF DISCHARGE:  11/19/2002                              CARDIAC CATHETERIZATION   PROCEDURES PERFORMED:  1. Left heart catheterization.  2. Selective coronary angiography.  3. Left ventricular angiography.  4. Saphenous vein graft angiography.  5. Angiography of the left internal mammary artery.  6. Subsequent stent placement in the right coronary artery.   CARDIOLOGIST:  Colleen Can. Deborah Chalk, M.D.   HISTORY:  Casey [name] presents with three days of substernal chest pain  late in the afternoon.  She had a previous stent placed in the right  coronary artery in March 2004.  She is referred now for follow up  catheterization.   TYPE AND SITE OF ENTRY:  Percutaneous right femoral artery.   CONTRAST MATERIAL:  Omnipaque.   MEDICATIONS:  Medications given prior to the procedure:  Valium 10 mg p.o.  Medications given during the procedure:  Integrelin, IV nitroglycerin and  heparin.   COMMENTS:  The patient tolerated the procedure well.   HEMODYNAMIC DATA:  Aortic pressure was 89/53.  LV pressure was 91/6-13.  There was no aortic valve gradient noted on pullback.   ANGIOGRAPHIC DATA:  1. Left Main Coronary Artery:  The left main coronary artery had a 50-60%     distal narrowing.   1. Left Anterior Descending:  The left anterior descending had     irregularities with a tubular 50% narrowing prior to the insertion of the     left internal mammary artery.  There was bidirectional flow.   1. Left Circumflex:  The left circumflex has diffuse disease and is     basically occluded.  There is bidirectional flow into an obtuse marginal     branch via saphenous vein  graft.   1. Right Coronary Artery:  The right coronary artery is a dominant vessel.     The stent was widely patent.  There appeared to be a 50% narrowing after     the stent and just prior to the crux of 60-70% narrowing.  Distal vessels     were satisfactory.   1. Saphenous Vein Graft:  Saphenous vein graft to the left circumflex was     widely patent with a nice insertion and good distal runoff.   1. Left Internal Mammary Graft:  The left internal mammary artery graft to     the LAD is patent.  It is mildly atretic, but has a satisfactory     insertion and good distal runoff.   1. Left Ventricular Angiogram:  Left ventricular angiogram was performed in     the RAO position.  The  overall cardiac size and silhouette were normal.     There was very minimal anterior hypokinesia.   ANGIOPLASTY PROCEDURE:  We exchanged for a 7 [place] JR-4 guide catheter.  A  Hi-Torque floppy guide wire was easily passed across both stenoses.  A 2.5 x  8 mm TAXUS stent was positioned just prior to the crux.  The stent was  inflated to a maximum of 10 atmospheres.  We then returned with a 3.0 x 16  mm TAXUS stent and placed that overlapping the old stent slightly.  It was  inflated to a maximum of 16 atmospheres.  The final angiographic result was  felt to be excellent.   OVERALL IMPRESSION:  1. Well-preserved global left ventricular function with a global ejection     fraction of 50-55%.  2. Patent saphenous vein graft with patent left internal mammary artery     graft.  3. Severe disease in the right coronary artery with stents placed proximally     and distally just prior to the crux.  4. Moderately severe atherosclerosis in the left main coronary, left     anterior descending and proximal left circumflex with patent distal     grafts.   DISCUSSION:  It is felt that these were most likely the culprit lesions for  Casey [name] at this point in time.  The two additional stents placed in the  right  coronary artery are felt to represent excellent revascularization.                                                 Colleen Can. Deborah Chalk, M.D.    SNT/MEDQ  D:  11/19/2002  T:  11/20/2002  Job:  [id number]

## 2010-07-19 NOTE — Cardiovascular Report (Signed)
[place]. [facility]  Patient:    Casey [name], Casey [name]                    MRN: [id number] Proc. Date: 06/26/99 Adm. Date:  [id number] Disc. Date: [id number] Attending:  Eleanora Neighbor CC:         Colleen Can. Deborah Chalk, M.D.             Richard A. Jacky Kindle, M.D.                        Cardiac Catheterization  INDICATIONS:  Ms. Lanese has had previous coronary artery bypass grafting, has had followup catheterization, last being in July of 1999.  At that point and time, she is felt to have 60% distal left main stenosis with the right coronary artery having 20-30% narrowings proximally with no significant focal disease.  It is felt we could manage her medically.  She had a somewhat discrete apical akinesis at that point and time.  She has done reasonably well but has had progressive angina and shortness of breath over the last two months.  She had basically a negative adenosine Cardiolite study but the study was limited because of obesity.  She presents with persistent and recurrent angina and is now referred for catheterization.  PROCEDURE:  Left heart catheterization with selective coronary angiography, saphenous vein graft angiography, left internal mammary artery and percutaneous angioplasty with stenting of the right coronary artery.  TYPE AND SITE OF ENTRY:  Percutaneous right femoral artery with Perclose.  CONTRAST MATERIAL:  Omnipaque.  CATHETERS:  A 6 French 4 curved Judkins right and left coronary catheters, 6 French pigtail ventriculographic catheter, left internal mammary graft catheter, a 7 [place] FL4 guide catheter, Hi-Torque Floppy guide wire, and a 3.5 x 20 mm CrossSail balloon and then subsequently a 3.5 x 23 mm Tetra stent.  MEDICATIONS GIVEN DURING THE PROCEDURE:  Heparin, IV nitroglycerin, Aggrastat and Versed 3 mg IV.  COMMENTS:  The patient tolerated the procedure well.  HEMODYNAMIC DATA:  The aortic pressure was 129/70, LV was  130/9 There was no aortic valve gradient noted on pullback.  ANGIOGRAPHIC DATA: 1. Left main coronary artery:  Left main coronary artery had a 60% distal    narrowing. 2. Left circumflex:  The left circumflex is totally occluded proximally.    However, there is some competitive flow. 3. Left anterior descending:  The left anterior descending has 50-60%    narrowing proximally and then approximately 70-90% narrowing in the    midportion.  There is competitive flow from the left internal mammary    graft. The diagonal vessel is large, somewhat tortuous but without    significant stenosis. 4. Intermediate coronary:  Intermediate coronary is free of significant    disease. 5. Right coronary artery:  The right coronary artery is a large dominant    vessel.  There is a proximal 95% stenosis.  In the midportion at the    level of the acute margin there is about a 60% narrowing that is somewhat    segmental.  The distal right coronary artery is free of significant    obstructive disease. 6. Saphenous vein graft to the obtuse marginal:  Widely patent with a nice    insertion and satisfactory distal runoff. 7. Left internal mammary graft to the LAD is widely patent with a nice    insertion and good distal runoff.  LEFT VENTRICULOGRAPHY:  Left ventricular angiogram  was performed in the RAO position.  Overall cardiac size and silhouette was normal.  There was a discrete area of apical akinesis.  Global ejection fraction would be estimated to be 60%.  Anterior wall contracted reasonably well.  ANGIOPLASTY PROCEDURE:  We used a JR4 guide a #7 [place] with no side holes.  Hi-Torque Floppy guide wire was passed across the lesion without difficulty.  We initially used a 3.5 x 20 mm CrossSail balloon to dilate the vessel to a maximum of 10 atmospheres.  We then returned with a 3.5 x 23 mm Tetra stent dilated to a maximum of 15 atmospheres proximally.  This resulted in excellent proximal resolve.  We  then turned our attention to the stenosis and the base would be sequentially beyond the area of the most severe stenosis.  After careful consideration we elected not to stent this mainly because of the duration of the segmental disease in this lesion would result in prohibitive length of stenting in the proximal right coronary artery and it is felt that it was not overly severely narrowed at this time and probably could be managed with medication.  It is felt that probably the six-month interval it could be returned and reevaluated and stented if needed at that time.  OVERALL IMPRESSION: 1. Successful angioplasty with stenting of the proximal right coronary artery    with residual disease of a moderate nature at the acute margin. 2. Persistent patency of the saphenous vein graft to the obtuse marginal and    left internal mammary graft to the left anterior descending. 3. Reasonable well-preserved left ventricular function with a discrete area    of apical akinesis. 4. A 60% left main stenosis with severe stenosis in the left circumflex and    moderately severe disease in the proximal and mid LAD.  DISCUSSION:  It is felt that clearly the culprit lesion was addressed.  There is certainly concern of this distal right coronary artery.  There also has to remain some concern over the left main stenosis and the proximal branches, although there clearly is some degree of retrograde fill from the currently patent bypass grafts and it is felt that nothing else needs to be done about that at this time.  It could be possible that she could have angioplasty of the left main coronary artery distally and since the left circumflex is bypass.  However, there is an intermediate branch and it certainly could cause compromise in that and it is felt that it is not needed at this time. The main concern for the future remains in the right coronary artery distal to the area stented. DD:  06/26/99 TD:   06/26/99 Job: [id number]

## 2010-07-19 NOTE — H&P (Signed)
[place]. [facility]  Patient:    Casey [name], Casey [name]                    MRN: [id number] Adm. Date:  [id number] Attending:  Eleanora Neighbor Dictator:   Jennet Maduro Earl Gala, R.N., A.N.P. CC:         Colleen Can. Deborah Chalk, M.D.                         History and Physical  CHIEF COMPLAINT: Recurrent chest pain.  HISTORY OF PRESENT ILLNESS: Ms. Haran has had previous anterior myocardial infarction and has had recent onset of shortness of breath.  She called the office earlier the afternoon of June 25, 1999 after an episode of chest discomfort.  She has previously been evaluated with 2D echocardiogram due to her shortness of breath, which showed ejection fraction at 50-60%.  She has been treated for hypertension with the addition of hydrochlorothiazide.  She notes that since her last office visit in March she has continued to not do well.  She continues to be quite fatigued as well as very limited with her activities due to dyspnea on exertion.  Today she felt quite weak and washed out, and went home at approximately 2 p.m. and tried to lay down.  She then had an episode of chest discomfort in the mid sternal region which frightened her and caused her more dyspnea.  She called the office and she was referred on to the emergency room, where she was pain-free.  She is now admitted for further evaluation and repeat cardiac catheterization.  PAST MEDICAL HISTORY:  1. ASCVD with previous anterior MI in 1992, status post angioplasty to the     LAD.  She has had follow-up catheterizations in 1996 and again in 1998,     and subsequently had coronary artery bypass grafting x 2, with left     internal mammary artery to the LAD and saphenous vein graft to the OM.     Her last catheterization was in July of 1999 and showed the graft to be     patent, with two vessel coronary disease including the left main.  There     was well preserved global left ventricular function.  2. Obesity.  3. Hypertension.  4. Chronic depression, on Zoloft.  5. Hypercholesterolemia.  CURRENT MEDICATIONS:  1. Premarin q.d.  2. Provera q.d.  3. Zocor 40 mg q.d.  4. Lopressor 25 mg b.i.d.  5. Aspirin q.d.  5. Zoloft 50 mg b.i.d.  6. Hydrochlorothiazide 25 mg q.d.  7. Recently Xenical 120 mg t.i.d.  ALLERGIES: No known drug allergies.  FAMILY HISTORY: Father died at age 6 with an MI.  Mother has had a history of heart disease and Alzheimers.  SOCIAL HISTORY: No smoking, no alcohol.  She is single.  REVIEW OF SYSTEMS: Otherwise as noted above.  PHYSICAL EXAMINATION:  GENERAL: She is an obese white female, in no acute distress.  VITAL SIGNS: Blood pressure 156/70, heart rate 76, respirations 18.  She was afebrile.  SKIN: Warm and dry.  Color is unremarkable.  LUNGS: Clear.  HEART: Regular rhythm.  ABDOMEN: Obese, soft, positive bowel sounds.  EXTREMITIES: Without edema.  LABORATORY DATA: Chest x-ray shows mild vascular congestion, there is no edema and no heart failure.  CBC is stable.  Chemistries are satisfactory except for an elevated glucose of 158.  Her CK and troponin are negative.  OVERALL IMPRESSIONS:  1. Recurrent chest pain.  2. Ongoing shortness of breath.  3. Known atherosclerotic cardiovascular disease with previous anterior     myocardial infarction and subsequent coronary artery bypass grafting     x 2.  4. Hypercholesterolemia.  5. Obesity.  6. Elevated glucose.  PLAN: She will be admitted and we will proceed on with cardiac catheterization.  The patient will be maintained on IV heparin and IV nitroglycerin, and continued on her home medications.DD:  06/26/99 TD:  06/26/99 Job: [id number]

## 2010-07-19 NOTE — Cardiovascular Report (Signed)
NAME:  Casey [name], Casey [name] NO.:  [id number]   MEDICAL RECORD NO.:  [id number]                   PATIENT TYPE:  INP   LOCATION:  6599                                 FACILITY:  [facility]   PHYSICIAN:  Colleen Can. Deborah Chalk, M.D.            DATE OF BIRTH:  December 23, 1940   DATE OF PROCEDURE:  05/30/2002  DATE OF DISCHARGE:  05/31/2002                              CARDIAC CATHETERIZATION   HISTORY:  The patient has had previous coronary artery bypass grafting.  She  presents with recurrent unstable angina.   PROCEDURES:  1. Left-heart catheterization with selective coronary angiography.  2. Left ventricular angiography.   TYPE AND SITE:  Percutaneous right femoral artery.   CATHETERS:  6-French 4 curved right and left Judkins and 4 flat left Judkins  coronary catheters, 6-French pigtail, ventriculographic catheter, 7-French  JR-4 guide with sideholes, Hi-Torque floppy guidewire, a 16 mm x3.5 TAXUS  stent.   MEDICATIONS GIVEN PRIOR TO PROCEDURE:  Valium 10 mg p.o.   MEDICATIONS GIVEN DURING THE PROCEDURE:  1. Versed 5 mg IV.  2. Fentanyl.  3. Integrilin.  4. IV heparin.   COMMENTS:  The patient tolerated the procedure well.   HEMODYNAMIC DATA:  The aortic pressure was 126/85.  LV was 129/6/13.  There  was no aortic valve gradient noted on pullback.   ANGIOGRAPHIC DATA:  1. Left main coronary artery.  The left main coronary artery had 50+ percent     distal narrowing.  2. Left circumflex:  The left circumflex had segmental narrowing proximally.     There was competitive flow distally from the bypass graft.  3. Left anterior descending.  The left anterior descending is a moderate-     sized vessel.  There is competitive flow from internal mammary graft.     There is 30-40% narrowing just proximal to the insertion of the graft.     Two large diagonal vessels are relatively free of disease.  4. Right coronary artery.  The right coronary artery has persistent  patency     to the area that was stented proximally.  There is a 95% focal stenosis     just at the acute margin.  There are distal 50-60% narrowings prior to     the crux and 60% narrowing at the origin of the posterior descending     vessel.  It is clear that the tightest of the lesions is at the level of     the acute margin.  5. Saphenous vein graft to the obtuse marginal was widely patent with smooth     body of the graft and nice insertion.  There is good distal runoff.  6. Left internal mammary graft.  The LAD is widely patent with a nice     insertion, and good distal runoff.  It is a tortuous graft, but it is     patent.   LEFT VENTRICULAR ANGIOGRAM:  1.  Left ventricular angiogram is performed in the RAO position.  2. Overall cardiac size and silhouette are normal.  3. There is mild apical hypokinesis, but the global ejection fraction is     estimated to be at 60-65%.  4. There is no mitral regurgitation noted.   ANGIOPLASTY PROCEDURE:  Using a JR-4 guide with sideholes and a high-torque  floppy guidewire, successful access to the vessel was obtained.  Using a 16  mm TAXUS stent, (3.5 mm in diameter), it was positioned across the stenosis  and inflated to a maximum of 12 atmospheres.  The final angiographic result  was felt to be excellent with good distal flow.  There was a residual distal  disease present, but we elected not to proceed on with any intervention at  this location.   OVERALL IMPRESSION:  1. Well-preserved left ventricular function.  2. Patent left internal mammary graft to the left anterior descending artery     (LAD) and a patent saphenous vein graft to the left circumflex with 95%     focal stenosis in the right coronary artery, 60% distal left main     stenosis, and moderate distal disease in the right coronary artery.  3. Successful stent placement in the right coronary artery.                                               Colleen Can. Deborah Chalk,  M.D.    SNT/MEDQ  D:  05/31/2002  T:  05/31/2002  Job:  [id number]

## 2010-07-19 NOTE — Discharge Summary (Signed)
NAME:  Casey [name], LAUBE NO.:  [id number]   MEDICAL RECORD NO.:  [id number]                   PATIENT TYPE:  OIB   LOCATION:  6533                                 FACILITY:  [facility]   PHYSICIAN:  Colleen Can. Deborah Chalk, M.D.            DATE OF BIRTH:  1940/11/08   DATE OF ADMISSION:  11/18/2002  DATE OF DISCHARGE:  11/19/2002                                 DISCHARGE SUMMARY   PRIMARY DISCHARGE DIAGNOSIS:  Recurrent episodes of chest pain/unstable  angina with subsequent repeat cardiac catheterization with normal ejection  fraction, discrete apical akinesis, patent stent in the previously placed  right coronary, with subsequent stent placement to the distal right coronary  (3.0 x 16 mm TAXUS), subsequent stent to the distal at the area of the crux  (2.5 x 8 mm TAXUS) left internal mammary to the left anterior descending is  patent, vein graft to the obtuse marginal is patent, left main coronary  artery has 30-40-% distal narrowing, the left anterior descending has a 60%  segmental narrowing with a left internal mammary artery inserted distally,  the left circumflex has a severe proximal lesion with bidirectional flow.   SECONDARY DISCHARGE DIAGNOSIS:  1. Arteriosclerotic cardiovascular disease, previous history of anterior     myocardial infarction in 1992 status post angioplasty to the left     anterior descending and subsequent bypass grafting x2 in 1998 with     subsequent stent placement to the right coronary in March of 2004.  2. Obesity.  3. Diabetes.  4. Hypertension.  5. Hyperlipidemia.  6. Depression.   HISTORY OF PRESENT ILLNESS:  The patient is a 70 year old white female who  has multiple medical problems.  She presented to the office as a work-in  appointment on November 18, 2002 with recurrent bouts of chest pain in the  afternoon for the past three days.  She had not used nitroglycerin.  It was  identical to her previous chest pain  syndrome.  She had no other associated  symptoms.  She was subsequently admitted for elective cardiac  catheterization.   Please see the dictated History and Physical for further patient  presentation and profile.   LABORATORY DATA ON ADMISSION:  CBC was normal.  Chemistries were normal  except for a glucose of 106.  PT and PTT were unremarkable.  Cardiac enzymes  x1 were negative.   HOSPITAL COURSE:  The patient was admitted electively from the office to  short stay in order to undergo elective cardiac catheterization.  That  procedure was tolerated well without any now known complications.  The  findings were as noted above.  Two TAXUS stents were subsequently placed to  the right coronary with an overall satisfactory result.  Postprocedure she  was transferred to 6500 and today on November 19, 2002 she is doing well  without complaints.  She has had no further chest pain.  Her groin is  unremarkable and she is felt to be a stable candidate for discharge today.   DISCHARGE CONDITION:  Stable.   DISCHARGE MEDICATIONS:  We will resume Zocor 40 mg daily, Lopressor 25 mg  b.i.d., aspirin daily, Zoloft 50 mg b.i.d., Plavix 75 mg for six more  months, Altace 5 mg a day, Protonix 40 mg a day, Norvasc 2.5 mg b.i.d.,  Zetia 10 mg a day, and potassium 20 mEq a day.  She may stop  hydrochlorothiazide.   ACTIVITY:  Her activity is to be light over the next few days.   She is to place an ice pack to the right groin if needed and otherwise we  will plan on seeing her back in the office in approximately 10-14 days,  certainly sooner if problems arise.      [name] C. Earl Gala, N.P.                 Colleen Can. Deborah Chalk, M.D.    LCO/MEDQ  D:  11/19/2002  T:  11/20/2002  Job:  [id number]   cc:   Gwen Pounds, M.D.  [address]  [place]  [place] [address]  Fax: [phone]

## 2010-07-19 NOTE — Consult Note (Signed)
NAMEERCEL, Casey [name]   Casey [name]          PATIENT TYPE:  INP   LOCATION:  5708                         FACILITY:  [facility]   PHYSICIAN:  Casey [name], M.D.  DATE OF BIRTH:  [birth year]   DATE OF CONSULTATION:  06/30/2005  DATE OF DISCHARGE:                                   CONSULTATION   ATTENDING PHYSICIAN ON TIME OF CONSULT:  Casey [name], M.D.   CHIEF COMPLAINT:  For Casey management for diabetes mellitus.   HPI:  Patient is a 70 year old female who is a primary patient of Dr. Oda [name], at [facility],  who we were asked to see by Dr.  Manson Passey for consult for Casey management of her diabetes mellitus.  The  patient was admitted by his service for a 3 day history of swelling in the  neck and the patient could not swallow.  The swelling is on the anterior  left neck and the left submandibular area with tightness and throat  tightness as well as swelling, difficulty swallowing and loss of voice.  The  patient denies any fever or chills and she is currently able to tolerate  p.o. and take, however, has had decreased p.o. intake over the last day.  No  sick contacts.  The patient manages her diabetes with diet control.  Her  last hemoglobin A1c was noted to be 6.9.   REVIEW OF SYSTEMS:  CONSTITUTIONAL:  No fevers or chills.  CARDIOVASCULAR:  No chest pain.  PULMONARY:  No shortness of breath.  GI:  No diarrhea.  GU:  No frequency.  SKIN:  No rashes.   PAST Casey HISTORY:  Is significant for:  1.  Diabetes mellitus type 2 controlled by diet.  2.  Depressive disorder.  3.  Coronary artery disease status post CABG in 1998 as well as a stent in      2004 and catheterization in 2006.  4.  Lupus.  5.  Headaches.   HOME MEDICINES:  1.  Altace 5 mg.  2.  Aspirin one daily 81 mg.  3.  Calcium carbonate 600 mg daily.  4.  Metoprolol 25 mg p.o. b.i.d.  5.  Norvasc 5 mg; however, patient's home  med it was 10 mg.  6.  Paxil 40 mg daily.  7.  Plavix 75 mg daily.  8.  Protonix 40 mg daily.  9.  Zetia 10 mg daily.  10. Zocor 40 mg daily.   ALLERGIES:  NO KNOWN DRUG ALLERGIES.   Patient has had a colonoscopy back in 2003.   SOCIAL HISTORY:  She owns a Nurse, adult.  She has two grandsons, Thayer Ohm and  Coulee Dam.   FAMILY HISTORY:  Is noncontributory.   VITAL SIGNS ON ADMISSION:  Blood pressure 120/71, heart rate 71,  respirations 18, temperature was 97.4.  She was sating 93% on room air.  Her  CBGs were in the 140's-190's.  GENERAL APPEARANCE:  She is no acute distress, able to converse with a  decreased voice.  HEAD:  Was normocephalic, atraumatic.  EYES:  Her extraocular  muscles were intact.  MOUTH AND THROAT:  There was a large left neck area, it was tender to  palpation, there was dry mucous membranes, no noted erythema, it was in the  submandibular gland on the left side.  CHEST AND BREASTS:  No rashes.  LUNGS:  Were clear to auscultation bilaterally.  HEART:  Was regular rate and rhythm.  No murmurs, rubs or gallops.  ABDOMEN:  Soft, nontender, nondistended.  CRANIAL NERVES:  II-XII are intact.   LABS ON ADMISSION:  Sodium 137, potassium 3.6, chloride 101, bicarbonate 26,  BUN 10, creatinine 0.7, glucose is 177, t. bili 1.9, alk phos 68, AST 20,  ALT 38, total protein 6.3, albumin 3.3, calcium 8.4, PTT 28, PT 14, INR 1.4,  platelet count 5.5.  White blood cell count 15.2, hemoglobin 14.4,  hematocrit 41.8, platelets 306, MCV 89.  CT of the neck showed radiopaque  foreign bodies within the left submandibular space in the upper neck as  described above.  There were __________ in the parapharyngeal space and  superiorly to the temporomandibular joint.  There was extensive soft tissue  swelling of the left side of the upper neck and floor of her mouth, no  abscess noted.  X-ray films of the neck showed no airway abnormality.  Recommended CT scan.   ASSESSMENT AND PLAN:   This is a 70 year old white female with a left neck  swelling confirmed by CT scan with left submandibular sialoadenitis  secondary to a stone.  1.  Left sialoadenitis.  Per the Infectious Disease and Ear, Nose and      Throat, will continue her antibiotics which were chosen for vancomycin      and Zosyn.  This is to be managed by the primary team.  Currently, she      is afebrile.  2.  Diabetes mellitus type 2.  She is currently diet controlled.  Her      hemoglobin A1c was 6.9 in her last visit.  Will place her on a      carbohydrate-modified diet with sliding scale insulin.  May have      increased blood sugar secondary to the infection.  Will check her CBCs      q.a.c. and h.s.  No bedtime coverage for her insulin.  We will place her      on a sliding scale for insulin.  3.  Coronary artery disease. __________. She had a stent in 2004, she is      currently on Plavix, with CABG in 1998 and a catheterization in 2006.      We will continue her aspirin and metoprolol 25 mg p.o. b.i.d.  There is      no current chest pain, no shortness of breath, no acute coronary event      at this time.  4.  History of depression.  Will continue her Paxil at 40 mg p.o. daily.  5.  Hypertension.  She is currently stable, receiving Norvasc.  6.  History of __________ .  Currently with no active issues.  7.  Deep vein thrombosis prophylaxis.  She is ambulatory.  8.  Gastrointestinal prophylaxis.  She is going to be on Protonix 40 mg p.o.      daily.  9.  Electrolytes.  She has had decreased by mouth intake.  Currently she is      on a full liquid diet.  Will place her on      a carbohydrate modified diet once she is  tolerating by mouth.  We will      go ahead and increase her IV fluids to 125 mL of half normal saline with      KCL.  We may decrease once she is tolerating her full liquids.   We will be happy to follow this patient with you.     Casey [name], M.D.     MB/MEDQ  D:  06/30/2005  T:   07/01/2005  Job:  [id number]   cc:   Grant Ruts., D.D.S.  Fax: [phone]   Casey [name], Dr.

## 2010-07-19 NOTE — H&P (Signed)
Casey [name], Casey [name] NO.:  [id number]   MEDICAL RECORD NO.:  [id number]            PATIENT TYPE:   LOCATION:                                 FACILITY:   PHYSICIAN:  Colleen Can. Deborah Chalk, M.D.    DATE OF BIRTH:   DATE OF ADMISSION:  07/01/2006  DATE OF DISCHARGE:                              HISTORY & PHYSICAL   CHIEF COMPLAINT:  Chest pain.   HISTORY OF PRESENT ILLNESS:  Casey [name] is a 70 year old obese white  female who has multiple medical problems.  She presented to the office  as a work-in appointment on June 30, 2006.  She has had recurrence of  chest pressure over the last several days that has been off and on.  It  was identical to her previous chest pain syndrome and was relieved with  rest.  She does not use nitroglycerine due to severe headache.  She is  currently being treated for sinus infection.  She is on antibiotics but  is unsure of the name.  She has had a lot of associated belching.  Unfortunately she has not modified her cardiovascular risk factors.  She  is not exercising.  Blood sugars are unknown.  She has had no recent  laboratory work.  She is now referred for repeat cardiac  catheterization.   PAST MEDICAL HISTORY:  1. Known atherosclerotic cardiovascular disease.  She had a previous      history of an anterior MI in 1992 and subsequent angioplasty to the      LAD.  She underwent coronary artery bypass grafting x2 in 1998 with      left internal mammary to the LAD and a vein graft to the obtuse      marginal.  She had a stent placed to the right coronary in March      2004.  Her last catheterization was in April 2006.  She has had      stent placement to the distal right coronary, stent placement to      the distal area at the crux as well.  The last catheterization in      April 2006 showed the left main to have a distal 60% narrowing.      The LAD had irregularities and narrowing proximally that continued      out of the left  main stenosis.  The left circumflex has stenosis      proximally but bidirectional flow.  There is excellent flow      distally from the saphenous vein graft.  The intermediate is      basically normal.  The right coronary has patent stents in its      proximal segment.  One stenotic area has a 20-30% narrowing.  There      is a 50% osteal posterior descending artery narrowing.  The      saphenous vein graft to the obtuse marginal has a 20-40% narrowing      in the mid portion of the vessel.  The left internal mammary to the      LAD is patent  and relatively small, but does have bidirectional      flow.  Ejection fraction at that time was 60%.  2. Obesity.  3. Diabetes.  4. Hypertension.  5. Hyperlipidemia.  6. Depression.   ALLERGIES:  None.   CURRENT MEDICATIONS:  1. Antibiotic 1 tablet b.i.d.  2. Multivitamin daily.  3. Aspirin daily.  4. Paroxetine 40 mg a day.  5. Glucosamine daily.  6. Caltrate daily.  7. Lopressor 25 b.i.d.  8. Plavix 75 mg a day.  9. Zetia 10 mg a day.  10.Altace 5 mg a day.  11.Norvasc 10 mg a day.  12.Zocor 40 a day.   FAMILY HISTORY:  Family history is unchanged from the prior record.   SOCIAL HISTORY:  She is single.  She has no current alcohol or tobacco  use.   REVIEW OF SYMPTOMS:  Review of systems is as noted above and is  otherwise unremarkable.   PHYSICAL EXAMINATION:  GENERAL:  On exam, she is a middle-aged obese  white female.  She is in no acute distress.  VITAL SIGNS:  Her weight is 218 pounds.  Blood pressure is 120/70  sitting, 122/80 standing.  Heart rate is 76, respirations 18.  She is  afebrile.  SKIN:  Warm and dry.  Color is unremarkable.  LUNGS:  Clear.  HEART:  Regular rhythm.  BREASTS:  Pendulous.  ABDOMEN:  Full.  EXTREMITIES:  She has no peripheral edema.   Her EKG shown nonspecific ST-T wave changes with previous evidence of an  apical MI.  Other labs are pending.   OVERALL IMPRESSION:  1. Recurrence of chest  pain.  2. Long-standing history of ischemic heart disease with previous      coronary artery bypass grafting, as well as multiple cardiac      catheterizations and percutaneous coronary interventions.  3. Obesity.  4. Diabetes.  5. Hypertension.  6. Hyperlipidemia.   PLAN:  We will refer her on for diagnostic catheterization per Dr. Delane Ginger for Wednesday, April 30th.  The procedure has been reviewed in  full detail including the risks and benefits.  Patient has been given a  prescription for nitroglycerine to use as well with full instruction.  The further treatment and plan to follow per Dr. Harvie Bridge discretion.      Sharlee Blew, N.P.      Colleen Can. Deborah Chalk, M.D.  Electronically Signed    LC/MEDQ  D:  06/30/2006  T:  06/30/2006  Job:  [id number]   cc:   Vesta Mixer, M.D.  Gwen Pounds, MD

## 2010-07-19 NOTE — Cardiovascular Report (Signed)
NAME:  Casey [name], Casey [name] NO.:  [id number]   MEDICAL RECORD NO.:  [id number]                   PATIENT TYPE:  INP   LOCATION:  2912                                 FACILITY:  [facility]   PHYSICIAN:  Colleen Can. Deborah Chalk, M.D.            DATE OF BIRTH:  1940/07/04   DATE OF PROCEDURE:  03/03/2003  DATE OF DISCHARGE:  06/21/2002                              CARDIAC CATHETERIZATION   PROCEDURE:  Left heart catheterization with selective coronary angiography,  bypass graft angiography of the left internal mammary graft and a saphenous  vein graft and left ventricular angiogram.   CARDIOLOGIST:  Colleen Can. Deborah Chalk, M.D.   INDICATIONS:  The patient has a previous Taxus stent placed in her right  coronary artery on May 30, 2002, and presents with a four day history of  recurrent chest pain.  She is referred for repeat catheterization and  coronary arteriograms.  She had previous angioplasty of the left anterior  descending with followup catheterization in 1996 and coronary artery bypass  grafting in 1998.   TYPE AND SITE OF ENTRY:  Percutaneous right femoral artery.   CATHETERS:  A 6 French 4 curved Judkins right and left coronary catheters, 6  French pigtail ventriculographic catheter.   CONTRAST MATERIAL:  Omnipaque.   MEDICATIONS GIVEN DURING THE PROCEDURE:  Versed 4 mg IV.   COMMENTS:  The patient tolerated the procedure well.   HEMODYNAMIC DATA:  The aortic pressure was 100/70, LV pressure was 100/6/13.  There is no aortic valve gradient noted on pullback.   ANGIOGRAPHIC DATA:  1. The left main coronary artery has 50-60% distal narrowing.   1. The left anterior descending has irregularities.  There is bidirectional     flow via the left internal mammary artery graft to the mid LAD.   1. Left circumflex: The left circumflex has diffuse disease in his mid     portion.  There is bidirectional flow from the graft to an obtuse     marginal branch.   There is potentially a small vessel with compromised     flow because of diffuse narrowing in a continuation branch of the left     circumflex.   1. Right coronary artery:  The right coronary artery is a dominant vessel.     The stent is widely patent.  There is a 30% narrowing after the stent.     There is 40-50% stent narrowing at the crux.  Flow through the distal     vessels is felt to be satisfactory.   1. Saphenous vein graft to the left circumflex is widely patent.  There is a     nice insertion site. There is retrograde filling of the continuation     branch of the left circumflex and while there may be a bifurcation lesion     it is felt to not be severe and it is felt that there is satisfactory  flow to this vessel via the bypass graft.   1. Left internal mammary graft to the left anterior descending:  The left     internal mammary graft to the LAD is widely patent.  There is a nice     insertion, good distal runoff in a bidirectional manner.   LEFT VENTRICULAR ANGIOGRAM:  The left ventricular angiogram was performed in  the RAO position.  Overall cardiac size and silhouette were normal.  There  is very minimal anterior hypokinesis but the global ejection fraction was  estimated to be at 50-55%.   OVERALL IMPRESSION:  1. Well preserved left ventricular function with mild anterior hypokinesis.  2. Persistent patency of the saphenous vein graft to the obtuse marginal and     left internal mammary graft to the left anterior descending.  3. Persistent stent patency in the proximal right coronary artery.  4. Residual coronary atherosclerosis of 50% nature in the distal right     coronary artery with mild irregularities in the left anterior descending     and bidirectional fill of a very small continuation branch of the left     circumflex.   DISCUSSION:  It is felt that the patient can best be managed medically and  also with risk factor modification.                                                Colleen Can. Deborah Chalk, M.D.    SNT/MEDQ  D:  06/21/2002  T:  06/22/2002  Job:  [id number]

## 2010-07-19 NOTE — Discharge Summary (Signed)
NAME:  Casey [name], Casey [name] NO.:  [id number]   MEDICAL RECORD NO.:  [id number]                   PATIENT TYPE:  INP   LOCATION:  6522                                 FACILITY:  [facility]   PHYSICIAN:  Colleen Can. Deborah Chalk, M.D.            DATE OF BIRTH:  10-14-1940   DATE OF ADMISSION:  05/29/2002  DATE OF DISCHARGE:  05/31/2002                                 DISCHARGE SUMMARY   CHIEF COMPLAINT:  Unstable angina with subsequent negative cardiac enzymes  for elective cardiac catheterization as well as stent placement to the right  coronary artery.   SECONDARY DISCHARGE DIAGNOSES:  1. Atherosclerotic cardiovascular disease with previous history of anterior     myocardial infarction in 1992, status post angioplasty to the left     anterior descending with follow-up catheterizations in 1996 and     subsequent coronary artery bypass grafting x2 in 1998 followed by stent     placement to the right coronary artery.  2. Obesity.  3. Newly diagnosed diabetes.  4. Hyperlipidemia.  5. Depression.   HISTORY OF PRESENT ILLNESS:  The patient is a 70 year old white female who  has multiple medical problems.  She presents to the emergency room with  complaint of not feeling well over three days and subsequently with chest  pain on the night prior to admission.  It lasted for approximately 30  minutes.  It felt like her previous chest pain syndrome.  She had no other  associated symptoms.  She was seen and evaluated in the emergency room and  admitted for further evaluation.   Please see the dictated history and physical for further patient  presentation and profile.   LABORATORY DATA ON ADMISSION:  CBC was normal.  Chemistries were normal  except for a glucose of 153.  PT and PTT were unremarkable.  Cardiac enzymes  were negative x4.  Hemoglobin A1C elevated at 7.2.  Lipid profile showed  total cholesterol 187, triglycerides 376, HDL 43, LDL of 69.   Chest x-ray  showed status post coronary artery bypass grafting with stable  cardiomegaly.  There was vascular congestion and right perihilar  atelectasis.  This was a portable film.   EKG showed no acute changes.   HOSPITAL COURSE:  The patient was admitted.  She was placed on telemetry.  She was placed on IV nitroglycerin and heparin.  Serial cardiac enzymes were  drawn which proved to be negative for myocardial infarction.  We preceded on  with cardiac catheterization the following day.  The procedure was tolerated  well.  LV function was noted to be normal.  The previously placed stent in  the right coronary was patent.  Left internal mammary artery to the LAD was  patent.  There was a 40 to 60% distal left main stenosis.  The LAD has  competitive flow.  The left circumflex also demonstrated competitive flow.  The vein graft to the  obtuse marginal was patent.  There was a 90% narrowing  further down the right coronary artery, subsequent 3.5 x 16 mm TAXIS stent  was placed and overall satisfactory result was obtained.  She did have some  mild nausea in association as well as some intermittent chest pain which  subsequently resolved.  Post procedure she was maintained on IV Integrelin.  She was started on Plavix.  She was transferred to 6500 and today on May 31, 2002, she is doing well without complaints. She has been up and  ambulatory without problems and she is felt to be a stable candidate for  discharge today with further evaluation to occur on an outpatient basis.   DISCHARGE MEDICATIONS:  1. Plavix 75 mg for the next few months.  2. She will resume aspirin.  3. Zocor 40 mg a day.  4. Lopressor 25 b.i.d.  5. Zoloft 50 mg b.i.d.  6. Hydrochlorothiazide 25 mg a day.  7. A new prescription for nitroglycerin is provided.   ACTIVITY:  This is to be light over the next one week.   FOLLOW UP:  She is to follow up in our office in approximately one week.  She is asked to call to schedule  that appointment.  We will make  arrangements for her to have diabetic outpatient referral as well as  referral to cardiac rehab phase II.  Glucometer will be given for home use  and the dietician will see and instruct regarding ADA diet.     [name] C. Earl Gala, N.P.                 Colleen Can. Deborah Chalk, M.D.    LCO/MEDQ  D:  05/31/2002  T:  05/31/2002  Job:  [id number]   cc:   Pearlean Brownie, M.D.  [address] [place]  [place] [address]  Fax: [phone]

## 2010-07-19 NOTE — H&P (Signed)
NAME:  SAPHIA, VANDERFORD NO.:  [id number]   MEDICAL RECORD NO.:  [id number]                   PATIENT TYPE:  OIB   LOCATION:  2873                                 FACILITY:  [facility]   PHYSICIAN:  Colleen Can. Deborah Chalk, M.D.            DATE OF BIRTH:  1940/07/31   DATE OF ADMISSION:  11/18/2002  DATE OF DISCHARGE:                                HISTORY & PHYSICAL   CHIEF COMPLAINT:  Recurrent chest pain.   HISTORY OF PRESENT ILLNESS:  The patient is a 70 year old white female who  has known coronary disease.  She had previous revascularization dating back  to March of 2004.  She had repeat catheterization shortly thereafter which  showed stent patency.  She presents to the office with a work-in appointment  today on November 18, 2002, with a three-day history of afternoon chest  discomfort that occurs after work usually from 4 to 6 p.m.  She has not used  nitroglycerin.  She has had no other associated symptoms.  She is currently  pain-free.  She is now referred for repeat cardiac catheterization.   ALLERGIES:  No known drug allergies.   CURRENT MEDICATIONS:  1. K-Dur 20 mEq a day.  2. Zetia 10 mg a day.  3. Glucosamine two tablets daily.  4. Vitamin E daily.  5. Multivitamin daily.  6. Caltrate daily.  7. Nitroglycerin p.r.n.  8. Norvasc 2.5 b.i.d.  9. Protonix 40 daily.  10.      Altace 5 daily.  11.      Previously on hydrochlorothiazide 25 mg but none for the past one     month.  12.      Plavix 75 mg a day.  13.      Zoloft 50 b.i.d.  14.      Aspirin daily.  15.      Lopressor 25 b.i.d.  16.      Zocor 40 daily.   PAST MEDICAL HISTORY:  1. Atherosclerotic cardiovascular disease with previous history of anterior     MI in 1992, status post angioplasty to the LAD with follow-up     catheterizations in 1996.  Subsequent coronary artery bypass grafting x2     in 1998 followed by stent placement to the right coronary artery in March     of  2004 with repeat cardiac catheterization in April of 2004.  2. Obesity.  3. Diabetes.  4. Hypertension.  5. Hyperlipidemia.  6. Situational stress.  7. Depression.   FAMILY HISTORY:  Unchanged.   SOCIAL HISTORY:  Unchanged.   REVIEW OF SYMPTOMS:  As noted above and is otherwise unremarkable.   PHYSICAL EXAMINATION:  GENERAL APPEARANCE:  She is currently in no acute  distress.  She is pain-free.  VITAL SIGNS:  Blood pressure is 120/70 sitting, 120/70 standing.  Heart rate  76, respiratory rate 18, afebrile.  SKIN:  Warm and dry.  Color is unremarkable.  LUNGS:  Clear.  CARDIOVASCULAR:  Regular rhythm.  ABDOMEN:  Obese yet soft.  Positive bowel sounds, nontender.  EXTREMITIES:  No edema.   A 12-lead electrocardiogram shows evidence of an old apical MI with no acute  changes.   Other labs are pending.   IMPRESSION:  1. Recurrent bouts of unstable angina.  2. Known coronary disease with previous history of coronary artery bypass     grafting and subsequent stent placement, most recently in March of 2004.  3. Hyperlipidemia.  4. Hypertension.  5. Obesity.  6. Diabetes.   PLAN:  Will proceed on with admission to the hospital today.  Will make  plans for cardiac catheterization to occur later on today as well.      [name] C. Earl Gala, N.P.                 Colleen Can. Deborah Chalk, M.D.    LCO/MEDQ  D:  11/18/2002  T:  11/18/2002  Job:  [id number]   cc:   Gwen Pounds, M.D.  [address]  [place]  [place] [address]  Fax: [phone]

## 2010-07-19 NOTE — Op Note (Signed)
NAME:  Casey [name], COMPERE NO.:  [id number]   MEDICAL RECORD NO.:  [id number]                   PATIENT TYPE:  AMB   LOCATION:  ENDO                                 FACILITY:  [facility]   PHYSICIAN:  Petra Kuba, M.D.                 DATE OF BIRTH:  [birth year]   DATE OF PROCEDURE:  01/17/2002  DATE OF DISCHARGE:                                 OPERATIVE REPORT   PROCEDURE PERFORMED:  Colonoscopy with polypectomy.   ENDOSCOPIST:  Petra Kuba, M.D.   INDICATIONS FOR PROCEDURE:  Patient with history of colon polyps due for  repeat screening.  Consent was signed after the risks, benefits, methods and  options were thoroughly discussed in the past.   MEDICINES USED:  Demerol 70 mg, Versed 7 mg.   DESCRIPTION OF PROCEDURE:  Rectal inspection was pertinent for external  hemorrhoids, small.  Digital exam was negative.  A video pediatric  adjustable colonoscope was inserted and easily advanced around the colon to  the cecum.  This did not require any abdominal pressure or any position  changes.  No obvious abnormality was seen on insertion.  The cecum was  identified by the appendiceal orifice and the ileocecal valve.  The scope  was slowly withdrawn.  The prep was adequate.  There was some liquid stool  that required washing and suctioning.  The cecum and ascending were normal.  In the hepatic flexure, a tiny polyp was seen and hot biopsied.  On slow  withdrawal, two other tiny small polyps were seen in the transverse and were  hot biopsied.  All were put in the same container.  Scope was further  withdrawn.  No additional left-sided polyps or other abnormalities were seen  as we slowly withdrew back to the rectum.  Once back in the rectum, the  scope was retroflexed, pertinent for some internal hemorrhoids.  The scope  was straightened and readvanced a short ways up the left side of the colon.  Air was suctioned, scope removed.  The patient tolerated the  procedure well.  There was no obvious immediate complication.   ENDOSCOPIC DIAGNOSIS:  1. Internal and external hemorrhoids.  2. Three tiny to small transverse and hepatic flexure polyps hot biopsied.  3. Otherwise within normal limits to the cecum.    PLAN:  GI follow-up p.r.n.  Await pathology to determine future colonic  screening.  Otherwise return care to Dr. Deirdre Priest for the customary health  care maintenance including yearly rectals and guaiacs.                                               Petra Kuba, M.D.    MEM/MEDQ  D:  01/17/2002  T:  01/17/2002  Job:  [id number]   cc:  Colleen Can. Deborah Chalk, M.D.  [address], [address]  [place]  [place] [address]  Fax: [phone]   Pearlean Brownie, M.D.  [address] [place]  [place] [address]  Fax: [phone]

## 2010-07-19 NOTE — H&P (Signed)
NAME:  Casey [name], Casey [name] NO.:  [id number]   MEDICAL RECORD NO.:  [id number]                   PATIENT TYPE:  INP   LOCATION:  2912                                 FACILITY:  [facility]   PHYSICIAN:  Colleen Can. Deborah Chalk, M.D.            DATE OF BIRTH:  12-24-1940   DATE OF ADMISSION:  DATE OF DISCHARGE:                                HISTORY & PHYSICAL   CHIEF COMPLAINT:  Recurrent chest pain.   HISTORY OF PRESENT ILLNESS:  The patient is a 70 year old white female who  has most recently undergone stent placement to the right coronary artery on  May 30, 2002. She was discharged on Plavix in addition to her regular  medicines. She presents to the office on June 20, 2002. She has had  multiple bouts of chest pain over the past 4 days. She took nitroglycerin on  the day that she was seen in the office which did bring her some relief. She  has basically felt bad and notes that she basically feels the way that she  had prior to  her admission in March. In light of  her symptomatology as  well as known recent stent placement, she is now referred for admission with  plans for repeat cardiac catheterization.   PAST MEDICAL HISTORY:  1. 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 Status post angioplasty to the LAD, followup     catheterizations in 1996 and subsequently coronary artery bypass grafting     x2 in 1998, followed by stent placement to the right coronary artery,     with most recent stent placement to the right coronary artery in March     2004.  2. Obesity.  3. Recently diagnosed diabetes.  4. Hyperlipidemia.  5. Depression.   ALLERGIES:  None.   CURRENT MEDICATIONS:  1. Claritin daily.  2. Plavix 75 mg a day.  3. Hydrochlorothiazide 25 mg a day.  4. Zoloft 50 mg b.i.d.  5. Lopressor 25 b.i.d.  6. Zocor 40 every day.  7. Aspirin daily.   FAMILY HISTORY:  Unchanged.   SOCIAL HISTORY:   Unchanged.   REVIEW OF SYSTEMS:  As noted above and is otherwise unremarkable. She states  she has been compliant with all medicines.   PHYSICAL EXAMINATION:  GENERAL:  Her mood is somewhat somber. She is  currently pain free.  VITAL SIGNS:  Blood pressure elevated at 160/80 sitting, 170/80 standing,  heart rate 80, respirations 18, afebrile.  SKIN:  Warm and dry. Color is somewhat sallow.  NECK:  Supple and full.  LUNGS:  Basically clear.  HEART:  Regular rhythm.  ABDOMEN:  Obese.  EXTREMITIES:  Without edema.  NEUROLOGIC:  Intact.   LABORATORY DATA:  An EKG shows nonspecific changes.   IMPRESSION:  1. Recurrent bouts of angina in the setting of recent stent placement (3.5 x  16 mm Taxis stent) on May 30, 2002.  2. Previous history of coronary artery bypass grafting x2 in 1998.  3. Obesity.  4. Hypertension.  5. Newly diagnosed diabetes.  6. Hyperlipidemia.  7. Depression.   PLAN:  Will proceed on with admission to the hospital. She will need to  undergo repeat coronary angiography. Her home medicines will be continued.  She will be placed on IV nitroglycerin as well as IV heparin.     [name] C. Earl Gala, N.P.                 Colleen Can. Deborah Chalk, M.D.    LCO/MEDQ  D:  06/20/2002  T:  06/20/2002  Job:  [id number]   cc:   Pearlean Brownie, M.D.  [name] [place]  [place] [address]  Fax: [phone]

## 2010-07-19 NOTE — Consult Note (Signed)
Casey [name], Casey [name]             ACCOUNT NO.:  [id number]   MEDICAL RECORD NO.:  [id number]          PATIENT TYPE:  INP   LOCATION:  5708                         FACILITY:  [facility]   PHYSICIAN:  Jefry H. Pollyann Kennedy, MD     DATE OF BIRTH:  04-24-1940   DATE OF CONSULTATION:  06/30/2005  DATE OF DISCHARGE:                                   CONSULTATION   REASON FOR CONSULTATION:  Swollen neck.   HISTORY:  This is a 70 year old lady who yesterday started to develop  swelling of the right side of the neck and some discomfort under her tongue  and in her throat on the left side with some difficulty swallowing.  She has  a history of having had dental implants placed less than 1week ago.  She was  admitted through the emergency department and a CT scan was obtained which  reveals some soft tissue swelling, left side of the neck, with some air in  the masticator space and a radial opaque object adjacent to the  submandibular gland.   PAST MEDICAL HISTORY:  Significant for:  1.  Coronary artery disease status post CABG and stent placement.  2.  History of diabetes that is treated apparently without medication.  3.  She has a history of hypertension as well.   EXAMINATION:  She is a healthy appearing obese lady in no distress.  She is  having no difficulty with secretions and her breathing is unlabored.  There  is some mild fullness of the left submandibular area but without tenderness  and no induration or skin changes.  There is no trismus.  The oral cavity  and pharynx reveals soft mucosal surfaces and floor of mouth without any  significant swelling or granulation tissue.  There are some fresh sutures  within the gingival mucosa upper and lower.  There is no palpable floor of  mouth masses.  I am not able to express any saliva from either submandibular  duct.  NASAL EXAM:  Unremarkable.   CT scan reviewed.  No previous findings.   IMPRESSION:  Probable acute sialoadenitis probably  secondary to a  submandibular stone.  I agree with the plan for IV antibiotics.  Recommend  continue observation for the next day or two.  I suspect the symptoms will  resolve.  If not, then exploration of the duct with retrieval of the stone  will be the next appropriate step.  This will likely be necessary at some  point in the future, anyway, to prevent further attacks of this.  I will  follow along with the admitting team and will continue to make  recommendations as needed.      Jefry H. Pollyann Kennedy, MD  Electronically Signed    JHR/MEDQ  D:  06/30/2005  T:  07/01/2005  Job:  [id number]   cc:   Grant Ruts., D.D.S.  Fax: [phone]

## 2010-07-19 NOTE — H&P (Signed)
Casey [name], [name] NO.:  [id number]   MEDICAL RECORD NO.:  [id number]          PATIENT TYPE:  OIB   LOCATION:                               FACILITY:  [facility]   PHYSICIAN:  Colleen Can. Deborah Chalk, M.D.DATE OF BIRTH:  [birth year]   DATE OF ADMISSION:  06/05/2004  DATE OF DISCHARGE:                                HISTORY & PHYSICAL   CHIEF COMPLAINT:  Chest pain.   HISTORY OF PRESENT ILLNESS:  Ms. Casey [name] is a 70 year old white female who  has multiple medical problems.  She presented to the office as a work-in  appointment on 06/04/2004 with complaints of shortness of breath that has  been occurring over the past few days with just minimal activity.  She has  also begun to note midsternal chest discomfort when she tries to lie down at  night.  It is seemingly similar to her previous chest pain syndrome, yet  perhaps not as severe.  She has not used nitroglycerin. She has been under a  significant amount of stress.  Unfortunately she has not modified her  cardiovascular risk factors. She now presents for elective cardiac  catheterization.   PAST MEDICAL HISTORY:  1.  Atherosclerotic cardiovascular disease.  She has a history of an      anterior MI that dates back to 1992 with previous angioplasty to the      LAD.  She subsequently underwent coronary artery bypass grafting x2 per      Dr. Sheliah Plane in 1998 with left internal mammary to the LAD, and      reverse saphenous vein graft to the obtuse marginal.  She has had      previous stent placement to the right coronary in March of 2004 and,      again, with stenting x2 to the right coronary in September of 2004, with      her last catheterization being in September 2004 with other findings      showing ejection fraction of 50-55%, patent saphenous vein graft with      patent left internal mammary artery graft, moderately severe disease in      the left main, LAD, and proximal left circumflex.  At that time it  was      felt that the culprit lesions had been addressed and excellent      revascularization had been obtained.  2.  Obesity.  3.  Diabetes.  4.  Hypertension.  5.  Hyperlipidemia.  6.  Chronic anxiety and depression.   ALLERGIES:  None.   CURRENT MEDICATIONS:  1.  Protonix 40 mg a day.  2.  Glucosamine daily.  3.  Caltrate daily.  4.  Zoloft 100 mg a day.  5.  Lopressor 25 b.i.d.  6.  Plavix 75 mg a day.  7.  Zetia 10 mg a day.  8.  Altace 5 mg a day.  9.  Norvasc 5 mg a day.  10. Zocor 40 daily.   FAMILY HISTORY:  Unchanged.   SOCIAL HISTORY:  There is no current alcohol or tobacco use.   REVIEW OF SYSTEMS:  Basically as noted above.  She has had no recent fever,  flu or cough. She has just become short of breath over the last couple of  days with minimal activity.  She has had no symptoms at rest.  However, her  chest pain occurs when trying to lie down at night to go to sleep. She has  been maintained on proton pump inhibitor.  Unfortunately she has gained  weight.  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   PHYSICAL EXAMINATION:  GENERAL:  On exam she is a middle-aged female who is  obese.  She is currently in no acute distress.  VITAL SIGNS:  Blood pressure 120/70, sitting; 130/80 standing.  Weight 222  pounds.  Heart rate 72 and regular.  Respirations 18.  She is afebrile.  SKIN:  Warm and dry.  Color is unremarkable.  HEENT:  Unremarkable.  NECK:  Supple and full without bruits.  LUNGS:  Lungs were basically clear.  CARDIAC EXAM:  Shows a regular rhythm.  BREASTS:  The breasts are noted to be quite pendulous.  ABDOMEN:  The abdomen is protuberant, obese, yet soft, positive bowel  sounds.  EXTREMITIES:  Without edema.  NEUROLOGIC:  Shows no gross focal deficits.   Pertinent labs are pending.   A 12-lead electrocardiogram shows old apical MI, but with no acute changes  and is basically unchanged from prior  tracings.   OVERALL IMPRESSION:  1.  Chest pain of questionable etiology.  2.  Shortness of breath of new onset.  3.  Known ischemic heart disease with previous bypass grafting in 1998 and      subsequent stent placement to the right coronary in March 2004 and again      in September of 2004 with her last catheterization being in December      2004 with satisfactory findings.  4.  Obesity.  5.  Diabetes.  6.  Hypertension.  7.  Hyperlipidemia.  8.  Chronic anxiety.   PLAN:  We will proceed on with repeat cardiac catheterization.  The  procedure has been reviewed in full detail and she is willing to proceed on  Wednesday, June 05, 2004.      LC/MEDQ  D:  06/04/2004  T:  06/04/2004  Job:  [id number]   cc:   Pearlean Brownie, M.D.  Fax: [phone]   Colleen Can. Deborah Chalk, M.D.  Fax: [phone]

## 2010-07-19 NOTE — Discharge Summary (Signed)
NAME:  NADEZHDA, POLLITT NO.:  [id number]   MEDICAL RECORD NO.:  [id number]                   PATIENT TYPE:  INP   LOCATION:  2912                                 FACILITY:  [facility]   PHYSICIAN:  Colleen Can. Deborah Chalk, M.D.            DATE OF BIRTH:  [birth year]   DATE OF ADMISSION:  06/20/2002  DATE OF DISCHARGE:  06/21/2002                                 DISCHARGE SUMMARY   PRIMARY DISCHARGE DIAGNOSIS:  1. Recurrent chest pain with subsequent elective repeat cardiac     catheterization, revealing mild anterior hypokinesis, 50-60% distal left     main, and bidirectional flow via previous bypass surgery from the left     internal mammary artery to the left anterior descending.  The stent     previously placed in the right coronary artery is patent.  There is a     distal 40-50% narrowing at the crux.  The patient will continue medical     management.  2. Hypokalemia, now on potassium replacement.  3. Obesity.  4. Depression.  5. Hyperlipidemia.  6. Newly diagnosed diabetes.   HISTORY OF PRESENT ILLNESS:  The patient is a 70 year old female who  presented to the office with a four-day history of recurrent bouts of chest  pain.  She had taken nitroglycerin with some relief.  She was subsequently  admitted in light of recent percutaneous coronary intervention towards the  end of March.  She was subsequently referred for admission for pain  stabilization and plans for repeat cardiac catheterization.   Please see the dictated history and physical for further patient  presentation and profile.   ADMISSION LABORATORY DATA:  CBC was normal.  Potassium was low at 2.7.  Cardiac enzymes were negative.  EKG showed no acute changes.   HOSPITAL COURSE:  The patient was admitted.  She was placed on IV  nitroglycerin as well as IV heparin. She ruled out negative for myocardial  infarction.  Her potassium was replaced accordingly.  We proceeded with  cardiac  catheterization the following morning.  Now, the patient will  continue with medical management.  From our standpoint, she is felt stable  for discharge after bedrest is complete.   CONDITION ON DISCHARGE:  Stable.   DISCHARGE MEDICATIONS:  She will resume all her previous medications which  include the following:   1. Zocor 40 mg q.d.  2. Lopressor 25 mg b.i.d.  3. Aspirin q.d.  4. Zoloft 50 mg b.i.d.  5. Plavix 75 mg q.d.  6. Hydrochlorothiazide 25 mg q.d.  7. Altace 5 mg q.d.  8. We will add Protonix 40 mg q.d. and potassium 20 mEq b.i.d.    FOLLOW UP:  She will be seen in the office in approximately 7-10 days.  She  should have follow-up lab on return.  She should call if problems develop in  the interim.     [name] C. Earl Gala,  N.P.                 Colleen Can. Deborah Chalk, M.D.    LCO/MEDQ  D:  06/21/2002  T:  06/22/2002  Job:  [id number]   cc:   Pearlean Brownie, M.D.  [address] [place]  [place] [address]  Fax: [phone]

## 2010-07-19 NOTE — Cardiovascular Report (Signed)
NAME:  Casey [name], Casey [name] NO.:  [id number]   MEDICAL RECORD NO.:  [id number]                   PATIENT TYPE:  OIB   LOCATION:  2866                                 FACILITY:  [facility]   PHYSICIAN:  Colleen Can. Deborah Chalk, M.D.            DATE OF BIRTH:  May 01, 1940   DATE OF PROCEDURE:  02/20/2003  DATE OF DISCHARGE:                              CARDIAC CATHETERIZATION   HISTORY:  Casey [name] had a stent in the right coronary artery in September  2004.  She had previous catheterization and previous stent of the right  coronary artery in March 2004.  She returns with a very similar type of  chest discomfort with back discomfort and is referred again for  catheterization.   PROCEDURE:  Left heart catheterization with selective coronary angiography,  left ventricular angiography, saphenous vein graft angiography x1,  angiography of the left internal mammary artery.   TYPE AND SITE OF ENTRY:  Percutaneous, right femoral artery with Perclose.   CATHETERS:  6 French 4 curved Judkins right and left coronary catheters, 6  French pigtail ventriculographic catheter.   CONTRAST MATERIAL:  Omnipaque.   MEDICATIONS GIVEN PRIOR TO PROCEDURE:  Valium 10 mg p.o.   MEDICATIONS GIVEN DURING THE PROCEDURE:  Versed 4 mg IV, Ancef 1 g IV.   COMMENTS:  The patient tolerated the procedure well.   HEMODYNAMIC DATA:  1. The aortic pressure was 119/65.  2. LV was 125/10-27.  3. There was no aortic valve gradient noted on pullback.   ANGIOGRAPHIC DATA:  1. The left main coronary artery has 40-50% distal narrowing.  2. Left anterior descending has bidirectional fill from internal mammary     graft.  The diagonal vessel is reasonably large and fills well.  There is     bidirectional flow in the continuation of the left anterior descending.  3. Left circumflex:  The left circumflex is a moderate size vessel.  There     is bidirectional fill in an obtuse marginal.  There is a 70%  narrowing in     the proximal part of the left circumflex.  4. Intermediate:  Intermediate is a moderate size vessel with some     tortuosity.  There does not appear to be a significant obstructive     disease present.  5. Right coronary artery:  The right coronary artery is a large dominant     vessel.  The stents in the right coronary artery are widely patent.     There is excellent flow through one acute marginal vessel which has     ostial compromise because it arises out of a stented segment.  This would     be approximately 60-70% in nature.  The distal right coronary artery has     irregularities, but has excellent flow and only mild diffuse distal     disease is present.  6. The saphenous vein graft to the obtuse  marginal is widely patent with     nice insertion and good distal run off.  7. Left internal mammary graft to the LAD is widely patent with nice     insertion and good distal run off.   LEFT VENTRICULAR ANGIOGRAM:  Left ventricular angiogram is performed in RAO  position.  Overall cardiac size, silhouette are normal.  There is very mild  anterior hypokinesia.  Global ejection fraction is 50-55%.   OVERALL IMPRESSION:  1. Reasonably well preserved global left ventricular function with mild     anterior hypokinesia.  2. Persistent patency of the stents in the right coronary artery.  3. Persistent patency of the left internal mammary artery graft to the left     anterior descending and saphenous vein graft to the obtuse marginal with     moderate atherosclerosis in the distal left main and moderately severe     stenosis in the proximal left circumflex.   DISCUSSION:  It is felt that Casey [name] will do well with medical management.  Perclose was performed.  Overall, it was felt that her prognosis is good and  will continue to try to modify cardiovascular risk factors.                                               Colleen Can. Deborah Chalk, M.D.    SNT/MEDQ  D:  02/20/2003  T:   02/20/2003  Job:  [id number]

## 2010-07-19 NOTE — H&P (Signed)
NAME:  Casey [name], Casey [name] NO.:  [id number]   MEDICAL RECORD NO.:  [id number]                   PATIENT TYPE:  INP   LOCATION:  3703                                 FACILITY:  [facility]   PHYSICIAN:  Colleen Can. Deborah Chalk, M.D.            DATE OF BIRTH:  05/10/1940   DATE OF ADMISSION:  05/29/2002  DATE OF DISCHARGE:                                HISTORY & PHYSICAL   CHIEF COMPLAINT:  Chest pain.   HISTORY OF PRESENT ILLNESS:  Casey [name] is a 70 year old white female who  has known coronary disease.  She had a previous anterior MI dating back to  15.  She has had previous percutaneous intervention and subsequently  underwent coronary artery bypass grafting in 1998.  She presents to the  emergency room with complaints of not feeling well over the past three days.  Really vague generalized malaise.  Last night, however, she had approximate  30 minute episode of chest pain.  Certainly not severe, but certainly was  new.  It had no associated symptoms.  She presented to the emergency room.  She is currently pain-free.  She is subsequently admitted for further  evaluation.   PAST MEDICAL HISTORY:  1. Obesity.  2. Hypertension.  3. Chronic depression.  4. Hyperlipidemia.  5. Atherosclerotic cardiovascular disease with previous anterior MI in 1992,     status post angioplasty to the LAD with followup catheterizations in 1996     and 1998 and subsequent coronary artery bypass grafting x2, the left     internal mammary to the LAD and a vein graft to the OM.  Last     catheterization in 4/01 with angioplasty and stenting of the proximal     right coronary, persistent patency of the vein graft to the OM as well as     left internal mammary to the LAD with reasonably well preserved LV     function.   It was felt that the culprit lesion was certainly adrift.  There was concern  of the distal right coronary as well as concern over left main stenosis and  proximal branches.   ALLERGIES:  None.   CURRENT MEDICATIONS:  Lopressor, aspirin, Zoloft.   FAMILY HISTORY:  Father died at 74 with MI.  Mother had a history of heart  disease and Alzheimer's.   SOCIAL HISTORY:  She is single, no smoking, no alcohol.   REVIEW OF SYSTEMS:  Otherwise, as noted above and otherwise, unremarkable.   PHYSICAL EXAMINATION:  GENERAL:  A pleasant, obese white female in no acute  distress.  VITAL SIGNS:  Blood pressure was initially elevated with 180 systolic,  currently down to 135/77.  Heart rate is in the 70's.  Respirations 18.  She  is afebrile.  SKIN:  Warm and dry.  Color is unremarkable.  LUNGS:  Essentially clear.  HEART:  Regular rhythm.  ABDOMEN:  Obese, soft, positive bowel sounds,  nontender.  EXTREMITIES:  Without edema.  NEUROLOGIC:  Intact.   PERTINENT LABORATORY:  First CK is negative.  Chemistries show a glucose of  143, hematocrit 40.  EKG with nonspecific changes.   OVERALL IMPRESSIONS:  1. Recurrent chest pain.  2. Nonischemic cardiac disease.  Previous bypass surgery as well as previous     myocardial infarction.  3. Obesity.  4. Elevated glucose.   PLAN:  She will be admitted.  Home medicines will be continued.  Followup  labs will be drawn.  Will need to proceed on with cardiac catheterization on  Monday.       [name] C. Earl Gala, N.P.                 Colleen Can. Deborah Chalk, M.D.    LCO/MEDQ  D:  05/29/2002  T:  05/30/2002  Job:  [id number]   cc:   Pearlean Brownie, M.D.  [address] [place]  [place] [address]  Fax: [phone]

## 2010-07-19 NOTE — Discharge Summary (Signed)
[place]  Patient:    Casey [name], CROCK                    MRN: [id number] Adm. Date:  [id number] Disc. Date: 06/27/99 Attending:  Eleanora Neighbor Dictator:   Jennet Maduro. Earl Gala, R.N., A.N.P. CC:         Colleen Can. Deborah Chalk, M.D.                           Discharge Summary  DISCHARGE DIAGNOSES:  1. Chest pain, negative for myocardial infarction, with subsequent cardiac     catheterization and stent deployment to the right coronary artery.  2. Atherosclerotic cardiovascular disease with a previous history of an     anterior myocardial infarction in 1992, with prior history of angioplasty     to the left anterior descending, with follow-up catheterization in     December 1996 and again in July 1998 subsequently leading to coronary     artery bypass grafting x 2 in July 1998, with left internal mammary artery     to the left anterior descending and vein graft to the obtuse marginal.  3. Obesity, recently started on Xenical.  4. History of depression.  5. Hypercholesterolemia, on Zocor.  6. Hypertension.  7. Glucose intolerance.  HISTORY OF PRESENT ILLNESS: Ms. Casey [name] is a 70 year old female who has known atherosclerotic cardiovascular disease, with previous coronary artery bypass grafting, who presents with recurrent chest pain and shortness of breath, and subsequently was admitted for further evaluation and to undergo coronary angiography.  Please see the dictated History and Physical for further patient presentation and profile.  LABORATORY DATA: On admission cardiac enzymes were negative x 3.  CBC showed a WBC of 6.9, hemoglobin 13, hematocrit 39, platelets 291,000.  Chemistries revealed a sodium of 138, potassium 4.3, chloride 102, BUN 10, creatinine 0.9, glucose 225.  Chest x-ray showed mild vascular congestion with no edema and no infiltrates.  EKG was not acute.  HOSPITAL COURSE: The patient was admitted and was started on IV  nitroglycerin and IV heparin, and referred on for cardiac catheterization on June 26, 1999. She was noted to have an elevated glucose level and this was repeated fasting, and continues to be elevated at 167.  On June 26, 1999 cardiac catheterization was performed by Dr. Roger Shelter.  Left ventricular function was essentially normal.  The right coronary artery has a new 95% proximal narrowing, with 50-60% narrowing distally.  The left main has a 50% distal narrowing.  The left circumflex is 100% occluded.  The saphenous vein graft to the obtuse marginal is unremarkable.  The LIMA to the LAD is unremarkable.  The LAD native has a 90% narrowing in the mid portion. Angioplasty with subsequent stent deployment to the right coronary artery was performed with a 3.5 x 20 mm Tetra.  The patient was placed on IV Aggrastat post procedure and transferred to 6500 for further monitoring and evaluation.  On June 27, 1999 the patient has done well.  She is having just some headache secondary to IV nitroglycerin but no recurrent chest pain.  Blood pressures remain stable.  Follow-up laboratory work this morning is stable except for a potassium of 3.3, which will be replaced.  Glucose is noted again to be elevated at 173.  Plans are now made to discontinue IV nitroglycerin and increase her activity, replace her potassium, and begin diabetic teaching and plans for  discharge later on today.  DISCHARGE CONDITION: Stable.  DISCHARGE MEDICATIONS:  1. Zocor 40 mg q.d.  2. Lopressor 25 mg b.i.d.  3. Hydrochlorothiazide 25 mg q.d.  4. Estrogen and Provera per her home routine.  5. Zoloft 50 mg b.i.d.  6. Aspirin q.d.  7. Xenical t.i.d.  8. Plavix 75 mg for the next 21 days.  DISCHARGE ACTIVITY: Light until next week.  DISCHARGE DIET: Diabetic diet - no sweets, low-fat.  WOUND CARE: She is to remove the dressing from the groin this afternoon and wash with soap and water and apply Neosporin with a  Band-Aid for the next three days.  FOLLOW-UP: She will follow up in our office in about ten to 14 days.  Will need to give consideration to repeat laboratory work at that time and probable hemoglobin A1C, as well as referral for diabetic teaching. DD:  06/27/99 TD:  06/27/99 Job: [id number]

## 2010-09-23 ENCOUNTER — Telehealth: Payer: Self-pay | Admitting: Nurse Practitioner

## 2010-09-23 NOTE — Telephone Encounter (Signed)
Pt called wanted to talk to a nurse about which Dr she will be seeing?

## 2010-09-23 NOTE — Telephone Encounter (Signed)
Called wanting to know who she has been assigned to follow her. Advised Dr. Deborah Chalk had assigned her to Dr. Sanjuana Kava. She will call and make an app when convenient for her since she lives out of town.

## 2010-09-27 ENCOUNTER — Telehealth: Payer: Self-pay | Admitting: Cardiovascular Disease

## 2010-09-27 NOTE — Telephone Encounter (Signed)
Former pt of Dr.Tennant. She has been reassigned to [facility] but cannot get an appointment until second week of Aug. She is concerned about that and wants to speak with Lawson Fiscal.

## 2010-09-27 NOTE — Telephone Encounter (Signed)
Called stating she was trying to get an app because she was having "the lifeless feeling" that she use to get when Dr. Deborah Chalk would cath her. But states she is feeling better. Asked her if she felt like she needed to be seen by Lawson Fiscal. Is seeing Dr. Sanjuana Kava 8/17. States she is feeling better and will call if she needs to be seen.

## 2010-10-14 ENCOUNTER — Ambulatory Visit: Payer: Self-pay | Admitting: Family Medicine

## 2010-10-16 ENCOUNTER — Encounter: Payer: Self-pay | Admitting: Family Medicine

## 2010-10-16 ENCOUNTER — Ambulatory Visit (INDEPENDENT_AMBULATORY_CARE_PROVIDER_SITE_OTHER): Payer: Medicare Other | Admitting: Family Medicine

## 2010-10-16 VITALS — BP 130/75 | HR 70 | Temp 98.0°F | Ht 64.0 in | Wt 219.8 lb

## 2010-10-16 DIAGNOSIS — R5383 Other fatigue: Secondary | ICD-10-CM | POA: Insufficient documentation

## 2010-10-16 DIAGNOSIS — E119 Type 2 diabetes mellitus without complications: Secondary | ICD-10-CM

## 2010-10-16 DIAGNOSIS — E785 Hyperlipidemia, unspecified: Secondary | ICD-10-CM

## 2010-10-16 LAB — CBC WITH DIFFERENTIAL/PLATELET
Basophils Absolute: 0 10*3/uL (ref 0.0–0.1)
HCT: 40.9 % (ref 36.0–46.0)
Hemoglobin: 13.7 g/dL (ref 12.0–15.0)
Lymphocytes Relative: 43 % (ref 12–46)
Monocytes Absolute: 0.5 10*3/uL (ref 0.1–1.0)
Neutro Abs: 2.5 10*3/uL (ref 1.7–7.7)
Neutrophils Relative %: 47 % (ref 43–77)
RDW: 13.4 % (ref 11.5–15.5)
WBC: 5.3 10*3/uL (ref 4.0–10.5)

## 2010-10-16 LAB — LIPID PANEL
Cholesterol: 135 mg/dL (ref 0–200)
Triglycerides: 82 mg/dL (ref <150)
VLDL: 16 mg/dL (ref 0–40)

## 2010-10-16 LAB — COMPREHENSIVE METABOLIC PANEL
CO2: 23 mEq/L (ref 19–32)
Creat: 0.5 mg/dL (ref 0.50–1.10)
Glucose, Bld: 172 mg/dL — ABNORMAL HIGH (ref 70–99)
Total Bilirubin: 1.4 mg/dL — ABNORMAL HIGH (ref 0.3–1.2)
Total Protein: 6.8 g/dL (ref 6.0–8.3)

## 2010-10-16 LAB — TSH: TSH: 2.41 u[IU]/mL (ref 0.350–4.500)

## 2010-10-16 LAB — POCT SEDIMENTATION RATE: POCT SED RATE: 9 mm/hr (ref 0–22)

## 2010-10-16 NOTE — Patient Instructions (Signed)
I will call you if your tests are not good.  Otherwise I will send you a letter.  If you do not hear from me with in 2 weeks please call our office.     

## 2010-10-16 NOTE — Progress Notes (Signed)
  Subjective:    Patient ID: Casey [name], female    DOB: [birth year], 70 y.o.   MRN: [id number]  HPI  FATIGUE Complains of intermittent (several days at a time) "lifelessness" where has trouble thinking and feels very tired.  After a few days is back to normal.  Has been happening over the last month or so.   Not related to diet or medications or any specfic symptoms.  Somewhat similar to what she felt like before her previous stents but no chest pain or dyspnea with exertion.    Symptoms Fever: no  Night sweats: no  Weight loss: no   Exertional chest pain: no  Dyspnea: feels shortness of breath when walking long distances but is not progressive  Cough: no  Hemoptysis: no  New medications: no  Leg swelling: no  Orthopnea: no  PND: no  Melena: no  Adenopathy: no  Severe snoring: She is unsure.  Dr Deborah Chalk mentioned possibility of sleep apnea in the past.  Daytime sleepiness: yes  Skin changes: no  Feeling depressed: yes  Anhedonia: yes  Altered appetite: no  Poor sleep: yes, awakens to urinate at least once nightly   Review of Symptoms - see HPI  PMH - History of depression, SLE (quiet for long period) and CAD    Review of Systems     Objective:   Physical Exam  Normally interactive Heart - Regular rate and rhythm.  No murmurs, gallops or rubs.    Lungs:  Normal respiratory effort, chest expands symmetrically. Lungs are clear to auscultation, no crackles or wheezes. Extremities:  ObeseNo cyanosis, edema, or deformity noted with good range of motion of all major joints.   Skin:  Intact without suspicious lesions or rashes Neck:  No deformities, thyromegaly, masses, or tenderness noted.   Supple with full range of motion without pain.        Assessment & Plan:

## 2010-10-16 NOTE — Assessment & Plan Note (Addendum)
New onset.  No obvious cause on exam.  Intermittent nature is unusual.   Will check labs.   Does not sound cardiac related but she relates symptoms somewhat like what she had before her stents.  Sees cardiology in 2 days.  If not related to heart and labs unrevealing then should investigate sleep apnea and depression

## 2010-10-16 NOTE — Assessment & Plan Note (Signed)
Lab Results  Component Value Date   HGBA1C 7.5 10/16/2010   HGBA1C 6.4 11/30/2008   CREATININE 0.6 12/21/2009   CHOL 125 06/07/2008   HDL 48 06/07/2008   TRIG 98 0/11/8117    Last eye exam and foot exam: No results found for this basename: HMDIABEYEEXA, HMDIABFOOTEX    Assessment: Diabetes control: controlled Progress toward goals: at goal Barriers to meeting goals: no barriers identified  Plan: Diabetes treatment: continue current medications Refer to: none Instruction/counseling given: discussed the need for weight loss

## 2010-10-17 ENCOUNTER — Encounter: Payer: Self-pay | Admitting: Family Medicine

## 2010-10-17 ENCOUNTER — Encounter: Payer: Self-pay | Admitting: Cardiovascular Disease

## 2010-10-18 ENCOUNTER — Encounter: Payer: Self-pay | Admitting: Cardiovascular Disease

## 2010-10-18 ENCOUNTER — Ambulatory Visit (INDEPENDENT_AMBULATORY_CARE_PROVIDER_SITE_OTHER): Payer: Medicare Other | Admitting: Cardiovascular Disease

## 2010-10-18 VITALS — BP 138/60 | HR 65 | Resp 14 | Ht 64.0 in | Wt 220.1 lb

## 2010-10-18 DIAGNOSIS — I2510 Atherosclerotic heart disease of native coronary artery without angina pectoris: Secondary | ICD-10-CM

## 2010-10-18 NOTE — Progress Notes (Signed)
History of Present Illness:70 yo female with history of CAD s/p 2 V CABG in 1998, HLD, DM, HTN  here today for cardiac follow up. She has been followed in the past by Dr. Deborah Chalk. Her cardiac issues date back to 1992 at which time she had an MI and PTCA LAD. She then had  2V CABG in 1998.   She describes feeling lifeless which is severe fatigue and similar to before her stents were placed. No chest pain or SOB but she has never had these symptoms prior to er cardiac procedures. She does not smoke.   Past Medical History  Diagnosis Date  . Empty sella syndrome     In 70's  . Lupus     Inactive since 1970  . Hyperlipidemia   . Diabetes mellitus   . Hypertension   . Coronary artery disease     Past Surgical History  Procedure Date  . Coronary artery bypass graft 10/1996    Current Outpatient Prescriptions  Medication Sig Dispense Refill  . amLODipine (NORVASC) 5 MG tablet Take 5 mg by mouth daily.        Marland Kitchen aspirin 325 MG tablet Take 325 mg by mouth daily.        Marland Kitchen atorvastatin (LIPITOR) 10 MG tablet Take 10 mg by mouth daily.        . Calcium Carbonate-Vitamin D (CALTRATE 600+D) 600-400 MG-UNIT per tablet Take by mouth.        . clopidogrel (PLAVIX) 75 MG tablet Take 75 mg by mouth daily.        Marland Kitchen ezetimibe (ZETIA) 10 MG tablet Take 10 mg by mouth daily.        . fish oil-omega-3 fatty acids 1000 MG capsule Take 1 g by mouth daily.        . Glucosamine 500 MG CAPS        . metoprolol succinate (TOPROL-XL) 25 MG 24 hr tablet Take 25 mg by mouth daily.        . Multiple Vitamin (MULTIVITAMIN) capsule        . PARoxetine (PAXIL) 20 MG tablet Take 20 mg by mouth every morning.        . simvastatin (ZOCOR) 40 MG tablet Take 40 mg by mouth at bedtime.          Allergies  Allergen Reactions  . Iohexol      Desc: life threatning 06/30/2005     History   Social History  . Marital Status: Divorced    Spouse Name: N/A    Number of Children: N/A  . Years of Education: N/A    Occupational History  . Retired    Social History Main Topics  . Smoking status: Never Smoker   . Smokeless tobacco: Never Used  . Alcohol Use: Not on file  . Drug Use: Not on file  . Sexually Active: Not on file   Other Topics Concern  . Not on file   Social History Narrative   Has a fiance with whom she shares a Orth[employer] clothes.  Lives part time in [place] and in [place].   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 2 grandsons Thayer Ohm and Maisie Fus - toubled 08/15/09 - Grandsons now 28 and 26. Her daughter (their mother) is dying.Has boyfriend. No DV.Sister Francella Solian.    Family History  Problem Relation Age of Onset  . Coronary artery disease Other     Review of Systems:  As stated in the  HPI and otherwise negative.   BP 138/60  Resp 14  Ht 5\' 4"  (1.626 m)  Wt 220 lb 1.9 oz (99.846 kg)  BMI 37.78 kg/m2  Physical Examination: General: Well developed, well nourished, NAD HEENT: OP clear, mucus membranes moist SKIN: warm, dry. No rashes. Neuro: No focal deficits Musculoskeletal: Muscle strength 5/5 all ext Psychiatric: Mood and affect normal Neck: No JVD, no carotid bruits, no thyromegaly, no lymphadenopathy. Lungs:Clear bilaterally, no wheezes, rhonci, crackles Cardiovascular: Regular rate and rhythm. No murmurs, gallops or rubs. Abdomen:Soft. Bowel sounds present. Non-tender.  Extremities: No lower extremity edema. Pulses are 2 + in the bilateral DP/PT.  EKG:NSR, rate 66 bpm. Incomplete RBBB. Old anterior infarct.

## 2010-10-18 NOTE — Assessment & Plan Note (Signed)
New onset weakness and fatigue similar to her prior presentation with progression of her CAD. No chest pain but she never has chest pain. Will arrange exercise myoview to exclude ischemia.

## 2010-10-18 NOTE — Patient Instructions (Signed)
Your physician recommends that you schedule a follow-up appointment in: 3-4 weeks with Dr. Clifton James  Your physician has requested that you have en exercise stress myoview. For further information please visit [email or web address] Please follow instruction sheet, as given.

## 2010-10-23 ENCOUNTER — Encounter (HOSPITAL_COMMUNITY): Payer: Medicare Other | Admitting: Radiology

## 2010-11-06 ENCOUNTER — Ambulatory Visit: Payer: PRIVATE HEALTH INSURANCE | Admitting: Family Medicine

## 2010-11-12 ENCOUNTER — Telehealth: Payer: Self-pay | Admitting: Cardiovascular Disease

## 2010-11-12 NOTE — Telephone Encounter (Signed)
Pt had chest disconfort last night but does not feel well today, no energy and light headed.  Please call her back and advise what she should do.

## 2010-11-12 NOTE — Telephone Encounter (Signed)
Spoke with pt. She reports chest pain Sunday evening which was relieved with one NTG. No chest pain since that time but does report feeling tired and light headed. Shortness of breath with exertion. Has not checked blood pressure.  Fatigue same as prior to office visit with Dr. Clifton James in August but light headed feeling is worse.  Pt was scheduled for myoview but states she cancelled as she was not feeling well. Was not having chest pain at that time but states she just did not feel well.  Will review with Dr. Clifton James. I instructed pt she should call 911 if symptoms were to worsen.

## 2010-11-12 NOTE — Telephone Encounter (Signed)
Spoke with pt and gave her Dr. Gibson Ramp recommendation. She does not want to reschedule myoview. I instructed pt if symptoms were to worsen she should go to ED to be evaluated.

## 2010-11-12 NOTE — Telephone Encounter (Signed)
Would reschedule stress myoview that she cancelled. Thanks, chris

## 2010-11-14 ENCOUNTER — Ambulatory Visit: Payer: Medicare Other | Admitting: Cardiovascular Disease

## 2010-12-13 LAB — URINALYSIS, ROUTINE W REFLEX MICROSCOPIC
Glucose, UA: NEGATIVE
Leukocytes, UA: NEGATIVE
Protein, ur: NEGATIVE
Specific Gravity, Urine: 1.015
pH: 6

## 2010-12-13 LAB — URINE MICROSCOPIC-ADD ON

## 2010-12-13 LAB — DIFFERENTIAL
Lymphocytes Relative: 25
Lymphs Abs: 1.5
Monocytes Relative: 9
Neutro Abs: 4
Neutrophils Relative %: 66

## 2010-12-13 LAB — I-STAT 8, (EC8 V) (CONVERTED LAB)
Acid-base deficit: 3 — ABNORMAL HIGH
Chloride: 106
HCT: 43
Operator id: 277751
Potassium: 3.6
TCO2: 23
pCO2, Ven: 35.3 — ABNORMAL LOW
pH, Ven: 7.394 — ABNORMAL HIGH

## 2010-12-13 LAB — CBC
Platelets: 278
RBC: 4.6
WBC: 6

## 2010-12-13 LAB — POCT I-STAT CREATININE
Creatinine, Ser: 0.5
Operator id: 277751

## 2010-12-13 LAB — POCT CARDIAC MARKERS
Operator id: 277751
Troponin i, poc: <0.05

## 2010-12-16 LAB — I-STAT 8, (EC8 V) (CONVERTED LAB)
BUN: 13
Chloride: 106
Glucose, Bld: 131 — ABNORMAL HIGH
Potassium: 4
pCO2, Ven: 43.2 — ABNORMAL LOW
pH, Ven: 7.391 — ABNORMAL HIGH

## 2010-12-16 LAB — CBC
HCT: 37.8
Hemoglobin: 13.1
MCV: 87.9
Platelets: 277
WBC: 7.2

## 2010-12-16 LAB — DIFFERENTIAL
Eosinophils Absolute: 0
Eosinophils Relative: 0
Lymphocytes Relative: 45
Lymphs Abs: 3.3
Monocytes Absolute: 0.9 — ABNORMAL HIGH

## 2010-12-24 ENCOUNTER — Other Ambulatory Visit: Payer: Self-pay | Admitting: Cardiology

## 2011-01-24 ENCOUNTER — Other Ambulatory Visit: Payer: Self-pay | Admitting: Cardiology

## 2011-02-27 ENCOUNTER — Other Ambulatory Visit: Payer: Self-pay | Admitting: Cardiology

## 2011-03-06 ENCOUNTER — Inpatient Hospital Stay (HOSPITAL_COMMUNITY)
Admission: EM | Admit: 2011-03-06 | Discharge: 2011-03-08 | DRG: 287 | Disposition: A | Discharge status: Home / Self Care | Payer: Medicare Other | Attending: Internal Medicine | Admitting: Internal Medicine

## 2011-03-06 ENCOUNTER — Telehealth: Payer: Self-pay | Admitting: Cardiovascular Disease

## 2011-03-06 ENCOUNTER — Other Ambulatory Visit: Payer: Self-pay

## 2011-03-06 ENCOUNTER — Encounter (HOSPITAL_COMMUNITY): Payer: Self-pay | Admitting: Emergency Medicine

## 2011-03-06 ENCOUNTER — Emergency Department (HOSPITAL_COMMUNITY): Payer: Medicare Other

## 2011-03-06 DIAGNOSIS — Z79899 Other long term (current) drug therapy: Secondary | ICD-10-CM

## 2011-03-06 DIAGNOSIS — Z7982 Long term (current) use of aspirin: Secondary | ICD-10-CM

## 2011-03-06 DIAGNOSIS — Z951 Presence of aortocoronary bypass graft: Secondary | ICD-10-CM

## 2011-03-06 DIAGNOSIS — M129 Arthropathy, unspecified: Secondary | ICD-10-CM | POA: Diagnosis present

## 2011-03-06 DIAGNOSIS — Z9861 Coronary angioplasty status: Secondary | ICD-10-CM

## 2011-03-06 DIAGNOSIS — E119 Type 2 diabetes mellitus without complications: Secondary | ICD-10-CM | POA: Diagnosis present

## 2011-03-06 DIAGNOSIS — E782 Mixed hyperlipidemia: Secondary | ICD-10-CM

## 2011-03-06 DIAGNOSIS — R079 Chest pain, unspecified: Secondary | ICD-10-CM

## 2011-03-06 DIAGNOSIS — E871 Hypo-osmolality and hyponatremia: Secondary | ICD-10-CM | POA: Diagnosis present

## 2011-03-06 DIAGNOSIS — I252 Old myocardial infarction: Secondary | ICD-10-CM

## 2011-03-06 DIAGNOSIS — I10 Essential (primary) hypertension: Secondary | ICD-10-CM | POA: Diagnosis present

## 2011-03-06 DIAGNOSIS — I2510 Atherosclerotic heart disease of native coronary artery without angina pectoris: Principal | ICD-10-CM

## 2011-03-06 DIAGNOSIS — E785 Hyperlipidemia, unspecified: Secondary | ICD-10-CM

## 2011-03-06 DIAGNOSIS — IMO0001 Reserved for inherently not codable concepts without codable children: Secondary | ICD-10-CM | POA: Diagnosis present

## 2011-03-06 DIAGNOSIS — Z7902 Long term (current) use of antithrombotics/antiplatelets: Secondary | ICD-10-CM

## 2011-03-06 DIAGNOSIS — Z888 Allergy status to other drugs, medicaments and biological substances status: Secondary | ICD-10-CM

## 2011-03-06 HISTORY — DX: Angina pectoris, unspecified: I20.9

## 2011-03-06 HISTORY — DX: Cellulitis and abscess of mouth: K12.2

## 2011-03-06 HISTORY — DX: Unspecified osteoarthritis, unspecified site: M19.90

## 2011-03-06 HISTORY — DX: Shortness of breath: R06.02

## 2011-03-06 HISTORY — DX: Acute myocardial infarction, unspecified: I21.9

## 2011-03-06 LAB — COMPREHENSIVE METABOLIC PANEL
ALT: 23 U/L (ref 0–35)
AST: 17 U/L (ref 0–37)
Alkaline Phosphatase: 100 U/L (ref 39–117)
CO2: 24 mEq/L (ref 19–32)
Calcium: 8.8 mg/dL (ref 8.4–10.5)
Chloride: 102 mEq/L (ref 96–112)
GFR calc Af Amer: >90 mL/min (ref >90)
GFR calc non Af Amer: >90 mL/min (ref >90)
Glucose, Bld: 356 mg/dL — ABNORMAL HIGH (ref 70–99)
Potassium: 4.5 mEq/L (ref 3.5–5.1)
Sodium: 134 mEq/L — ABNORMAL LOW (ref 135–145)
Total Bilirubin: 1.1 mg/dL (ref 0.3–1.2)

## 2011-03-06 LAB — CBC
Hemoglobin: 14.3 g/dL (ref 12.0–15.0)
MCH: 30 pg (ref 26.0–34.0)
RBC: 4.76 MIL/uL (ref 3.87–5.11)
WBC: 5.6 10*3/uL (ref 4.0–10.5)

## 2011-03-06 LAB — GLUCOSE, CAPILLARY
Glucose-Capillary: 231 mg/dL — ABNORMAL HIGH (ref 70–99)
Glucose-Capillary: 282 mg/dL — ABNORMAL HIGH (ref 70–99)
Glucose-Capillary: 309 mg/dL — ABNORMAL HIGH (ref 70–99)

## 2011-03-06 LAB — POCT I-STAT TROPONIN I
Troponin i, poc: 0 ng/mL (ref 0.00–0.08)
Troponin i, poc: 0 ng/mL (ref 0.00–0.08)

## 2011-03-06 LAB — HEPARIN LEVEL (UNFRACTIONATED): Heparin Unfractionated: 0.32 IU/mL (ref 0.30–0.70)

## 2011-03-06 LAB — CARDIAC PANEL(CRET KIN+CKTOT+MB+TROPI)
CK, MB: 1.6 ng/mL (ref 0.3–4.0)
Troponin I: <0.3 ng/mL (ref <0.30)

## 2011-03-06 MED ORDER — ASPIRIN 81 MG PO CHEW
324.0000 mg | CHEWABLE_TABLET | ORAL | Status: AC
  Administered 2011-03-07: 324 mg via ORAL
  Filled 2011-03-06: qty 4

## 2011-03-06 MED ORDER — ACETAMINOPHEN 325 MG PO TABS: 650.0000 mg | ORAL_TABLET | ORAL | Status: DC | PRN

## 2011-03-06 MED ORDER — CLOPIDOGREL BISULFATE 75 MG PO TABS
75.0000 mg | ORAL_TABLET | Freq: Every day | ORAL | Status: DC
  Administered 2011-03-06 – 2011-03-08 (×2): 75 mg via ORAL
  Filled 2011-03-06 (×2): qty 1

## 2011-03-06 MED ORDER — PREDNISONE 50 MG PO TABS
60.0000 mg | ORAL_TABLET | ORAL | Status: AC
  Administered 2011-03-07: 60 mg via ORAL
  Filled 2011-03-06: qty 1

## 2011-03-06 MED ORDER — SODIUM CHLORIDE 0.9 % IV SOLN
INTRAVENOUS | Status: DC
  Administered 2011-03-06 – 2011-03-07 (×3): via INTRAVENOUS

## 2011-03-06 MED ORDER — DIPHENHYDRAMINE HCL 50 MG/ML IJ SOLN
25.0000 mg | INTRAMUSCULAR | Status: AC
  Administered 2011-03-07: 25 mg via INTRAVENOUS
  Filled 2011-03-06: qty 1

## 2011-03-06 MED ORDER — METOPROLOL SUCCINATE ER 25 MG PO TB24
25.0000 mg | ORAL_TABLET | Freq: Every day | ORAL | Status: DC
  Administered 2011-03-06 – 2011-03-08 (×3): 25 mg via ORAL
  Filled 2011-03-06 (×3): qty 1

## 2011-03-06 MED ORDER — ALPRAZOLAM 0.25 MG PO TABS
0.2500 mg | ORAL_TABLET | Freq: Two times a day (BID) | ORAL | Status: DC | PRN
  Administered 2011-03-07: 0.25 mg via ORAL
  Filled 2011-03-06: qty 1

## 2011-03-06 MED ORDER — ROSUVASTATIN CALCIUM 20 MG PO TABS
20.0000 mg | ORAL_TABLET | Freq: Every day | ORAL | Status: DC
  Administered 2011-03-07 – 2011-03-08 (×2): 20 mg via ORAL
  Filled 2011-03-06 (×2): qty 1

## 2011-03-06 MED ORDER — INSULIN ASPART 100 UNIT/ML ~~LOC~~ SOLN
4.0000 [IU] | Freq: Once | SUBCUTANEOUS | Status: AC
  Administered 2011-03-06: 4 [IU] via SUBCUTANEOUS
  Filled 2011-03-06: qty 1

## 2011-03-06 MED ORDER — OMEGA-3-ACID ETHYL ESTERS 1 G PO CAPS
1.0000 g | ORAL_CAPSULE | Freq: Every day | ORAL | Status: DC
  Administered 2011-03-07 – 2011-03-08 (×2): 1 g via ORAL
  Filled 2011-03-06 (×2): qty 1

## 2011-03-06 MED ORDER — SODIUM CHLORIDE 0.9 % IV SOLN: 250.0000 mL | INTRAVENOUS | Status: DC | PRN

## 2011-03-06 MED ORDER — ONDANSETRON HCL 4 MG/2ML IJ SOLN: 4.0000 mg | Freq: Four times a day (QID) | INTRAMUSCULAR | Status: DC | PRN

## 2011-03-06 MED ORDER — FAMOTIDINE IN NACL 20-0.9 MG/50ML-% IV SOLN
20.0000 mg | INTRAVENOUS | Status: AC
  Administered 2011-03-07: 20 mg via INTRAVENOUS
  Filled 2011-03-06 (×2): qty 50

## 2011-03-06 MED ORDER — INSULIN ASPART 100 UNIT/ML ~~LOC~~ SOLN
0.0000 [IU] | Freq: Three times a day (TID) | SUBCUTANEOUS | Status: DC
  Filled 2011-03-06: qty 3

## 2011-03-06 MED ORDER — EZETIMIBE 10 MG PO TABS
10.0000 mg | ORAL_TABLET | Freq: Every day | ORAL | Status: DC
  Administered 2011-03-07 – 2011-03-08 (×2): 10 mg via ORAL
  Filled 2011-03-06 (×2): qty 1

## 2011-03-06 MED ORDER — ASPIRIN EC 81 MG PO TBEC
81.0000 mg | DELAYED_RELEASE_TABLET | Freq: Every day | ORAL | Status: DC
  Administered 2011-03-08: 81 mg via ORAL
  Filled 2011-03-06: qty 1

## 2011-03-06 MED ORDER — AMLODIPINE BESYLATE 5 MG PO TABS
5.0000 mg | ORAL_TABLET | Freq: Every day | ORAL | Status: DC
  Administered 2011-03-06 – 2011-03-08 (×3): 5 mg via ORAL
  Filled 2011-03-06 (×3): qty 1

## 2011-03-06 MED ORDER — PAROXETINE HCL 20 MG PO TABS
20.0000 mg | ORAL_TABLET | ORAL | Status: DC
  Administered 2011-03-07 – 2011-03-08 (×2): 20 mg via ORAL
  Filled 2011-03-06 (×3): qty 1

## 2011-03-06 MED ORDER — ADULT MULTIVITAMIN W/MINERALS CH
1.0000 | ORAL_TABLET | Freq: Every day | ORAL | Status: DC
  Administered 2011-03-07 – 2011-03-08 (×2): 1 via ORAL
  Filled 2011-03-06 (×2): qty 1

## 2011-03-06 MED ORDER — PREDNISONE 10 MG PO TABS
60.0000 mg | ORAL_TABLET | ORAL | Status: AC
  Administered 2011-03-06: 60 mg via ORAL
  Filled 2011-03-06: qty 1

## 2011-03-06 MED ORDER — INSULIN ASPART 100 UNIT/ML ~~LOC~~ SOLN
0.0000 [IU] | Freq: Every day | SUBCUTANEOUS | Status: DC
  Administered 2011-03-06: 5 [IU] via SUBCUTANEOUS
  Filled 2011-03-06 (×3): qty 3

## 2011-03-06 MED ORDER — ASPIRIN 81 MG PO CHEW
324.0000 mg | CHEWABLE_TABLET | Freq: Once | ORAL | Status: AC
  Administered 2011-03-06: 324 mg via ORAL
  Filled 2011-03-06: qty 4

## 2011-03-06 MED ORDER — NITROGLYCERIN 0.4 MG SL SUBL
0.4000 mg | SUBLINGUAL_TABLET | SUBLINGUAL | Status: AC | PRN
  Administered 2011-03-06 (×3): 0.4 mg via SUBLINGUAL
  Filled 2011-03-06: qty 75

## 2011-03-06 MED ORDER — NITROGLYCERIN 2 % TD OINT
1.0000 [in_us] | TOPICAL_OINTMENT | Freq: Four times a day (QID) | TRANSDERMAL | Status: DC
  Filled 2011-03-06: qty 1

## 2011-03-06 MED ORDER — SODIUM CHLORIDE 0.9 % IJ SOLN: 3.0000 mL | INTRAMUSCULAR | Status: DC | PRN

## 2011-03-06 MED ORDER — SODIUM CHLORIDE 0.9 % IV SOLN
20.0000 mL | INTRAVENOUS | Status: DC
  Administered 2011-03-06: 1000 mL via INTRAVENOUS

## 2011-03-06 MED ORDER — NITROGLYCERIN 0.4 MG SL SUBL: 0.4000 mg | SUBLINGUAL_TABLET | SUBLINGUAL | Status: DC | PRN

## 2011-03-06 MED ORDER — SODIUM CHLORIDE 0.9 % IJ SOLN
3.0000 mL | Freq: Two times a day (BID) | INTRAMUSCULAR | Status: DC
  Administered 2011-03-06 – 2011-03-07 (×3): 3 mL via INTRAVENOUS

## 2011-03-06 MED ORDER — INSULIN ASPART 100 UNIT/ML ~~LOC~~ SOLN
8.0000 [IU] | Freq: Once | SUBCUTANEOUS | Status: DC
  Filled 2011-03-06: qty 1

## 2011-03-06 MED ORDER — ASPIRIN EC 81 MG PO TBEC: 81.0000 mg | DELAYED_RELEASE_TABLET | Freq: Every day | ORAL | Status: DC

## 2011-03-06 MED ORDER — HEPARIN SOD (PORCINE) IN D5W 100 UNIT/ML IV SOLN
12.0000 [IU]/kg/h | Freq: Once | INTRAVENOUS | Status: DC
  Filled 2011-03-06: qty 250

## 2011-03-06 MED ORDER — HEPARIN SOD (PORCINE) IN D5W 100 UNIT/ML IV SOLN
950.0000 [IU]/h | INTRAVENOUS | Status: DC
  Administered 2011-03-06 – 2011-03-07 (×2): 950 [IU]/h via INTRAVENOUS
  Filled 2011-03-06 (×2): qty 250

## 2011-03-06 MED ORDER — HEPARIN BOLUS VIA INFUSION: 60.0000 [IU]/kg | Freq: Once | INTRAVENOUS | Status: DC

## 2011-03-06 MED ORDER — ZOLPIDEM TARTRATE 5 MG PO TABS
5.0000 mg | ORAL_TABLET | Freq: Every evening | ORAL | Status: DC | PRN
  Administered 2011-03-07: 5 mg via ORAL
  Filled 2011-03-06: qty 1

## 2011-03-06 MED ORDER — INSULIN ASPART 100 UNIT/ML ~~LOC~~ SOLN
0.0000 [IU] | Freq: Three times a day (TID) | SUBCUTANEOUS | Status: DC
Start: 2011-03-07 — End: 2011-03-08
  Administered 2011-03-07: 3 [IU] via SUBCUTANEOUS
  Administered 2011-03-07: 9 [IU] via SUBCUTANEOUS
  Administered 2011-03-07: 5 [IU] via SUBCUTANEOUS
  Administered 2011-03-08: 7 [IU] via SUBCUTANEOUS
  Filled 2011-03-06: qty 3

## 2011-03-06 MED ORDER — HEPARIN BOLUS VIA INFUSION
4000.0000 [IU] | Freq: Once | INTRAVENOUS | Status: AC
  Administered 2011-03-06: 4000 [IU] via INTRAVENOUS
  Filled 2011-03-06: qty 4000

## 2011-03-06 MED ORDER — MORPHINE SULFATE 4 MG/ML IJ SOLN
4.0000 mg | Freq: Once | INTRAMUSCULAR | Status: DC
  Filled 2011-03-06: qty 1

## 2011-03-06 NOTE — Progress Notes (Signed)
ANTICOAGULATION CONSULT NOTE - Initial Consult  Pharmacy Consult for Heparin Indication: chest pain/ACS  Allergies  Allergen Reactions  . Iohexol      Desc: life threatning 06/30/2005     Patient Measurements: Height: 5\' 5"  (165.1 cm) Weight: 210 lb (95.255 kg) IBW/kg (Calculated) : 57  Adjusted Body Weight:   Vital Signs: Temp: 98.1 F (36.7 C) (01/03 1754) Temp src: Oral (01/03 1754) BP: 137/93 mmHg (01/03 1754) Pulse Rate: 72  (01/03 1754)  Labs:  Basename 03/06/11 1842 03/06/11 1017  HGB -- 14.3  HCT -- 41.6  PLT -- 219  APTT -- 28  LABPROT -- 14.2  INR -- 1.08  HEPARINUNFRC 0.32 --  CREATININE -- 0.51  CKTOTAL -- --  CKMB -- --  TROPONINI -- --   Estimated Creatinine Clearance: 75.8 ml/min (by C-G formula based on Cr of 0.51).  Medical History: Past Medical History  Diagnosis Date  . Empty sella syndrome     In 70's  . Lupus     Inactive since 1970  . Hyperlipidemia   . Hypertension   . Coronary artery disease     MI with PTCA to LAD 1992. 2V CABG 1998. PTCA/stent to prox and distal RCA 2004.  DES to high-grade LM with balloon angioplasty of ramus intermedius 12/2009 (EF55% then)  . Submandibular abscess 2008  . Angina   . Shortness of breath   . Arthritis   . Myocardial infarction   . Diabetes mellitus     Assessment: Patient is a 71 y.o F admitted to the ED with c/o CP. First heparin level 0.32 in goal range.  Goal of Therapy:  Heparin level 0.3-0.7 units/ml   Plan:  Continue IV heparin at 950 units/hr and check next level in am.  Pasty Spillers 03/06/2011,7:45 PM

## 2011-03-06 NOTE — Progress Notes (Signed)
ANTICOAGULATION CONSULT NOTE - Initial Consult  Pharmacy Consult for Heparin Indication: chest pain/ACS  Allergies  Allergen Reactions  . Iohexol      Desc: life threatning 06/30/2005     Patient Measurements: Height: 5\' 5"  (165.1 cm) Weight: 210 lb (95.255 kg) IBW/kg (Calculated) : 57  Adjusted Body Weight:   Vital Signs: Temp: 98 F (36.7 C) (01/03 0856) Temp src: Oral (01/03 0856) BP: 104/77 mmHg (01/03 1030) Pulse Rate: 74  (01/03 1030)  Labs:  Basename 03/06/11 1017  HGB 14.3  HCT 41.6  PLT 219  APTT 28  LABPROT 14.2  INR 1.08  HEPARINUNFRC --  CREATININE 0.51  CKTOTAL --  CKMB --  TROPONINI --   Estimated Creatinine Clearance: 75.8 ml/min (by C-G formula based on Cr of 0.51).  Medical History: Past Medical History  Diagnosis Date  . Empty sella syndrome     In 70's  . Lupus     Inactive since 1970  . Hyperlipidemia   . Diabetes mellitus   . Hypertension   . Coronary artery disease     Assessment: Patient is a 71 y.o F admitted to the ED with c/o CP.  Goal of Therapy:  Heparin level 0.3-0.7 units/ml   Plan:  1) Heparin 4000 units IV bolus x1, then drip at 950 units/hr 2) Check 6 hour heparin level  Issiah Huffaker P 03/06/2011,12:18 PM

## 2011-03-06 NOTE — H&P (Signed)
History and Physical  Patient ID: NAEEMAH JASMER MRN: [id number], SOB: Mar 26, 1940 71 y.o. Date of Encounter: 03/06/2011, 1:25 PM  Primary Physician: Carney Living, MD, MD Primary Cardiologist: Dr. Clifton Bj Morlock  Chief Complaint: chest pain  HPI: 71 y/o F with hx CAD, HTN, HL, DM presented to [facility] with complaints of chest pain. She flew back from [facility] last night and at the airport here developed somewhat sudden onset L chest pain without any associated symptoms including SOB, n/v, diaphoresis. She came home and relaxed and felt somewhat better and was able to sleep. However, this morniand he is a willng she woke up with continued pain as well as a new chest heaviness/pressure that was reminiscent of her prior angina. She called our office and was instructed to take 1 SL NTG (which helped ease her pain) and proceed to ER. Here she got 3 SL NTG which progressively relieved her pain completely. She remains pain free. Troponin is neg x 1 at POC and EKG is without acute changes.   Of note, she also endorses recent increase in thirst for 1 month as well as fatigue and exhaustion during her trip to [facility]. When she saw Dr. Clifton Irving Lubbers for fatigue also reminiscent of prior to needing stents in 10/2010, she was referred for [facility] but she cancelled those appointments. Glucose is 396 today. A1C was 7.5 in August. She is not on any hypoglycemics and tells me she thought she had pre-diabetes but was told no meds were needed. She does not check her sugars at home. She has otherwise been compliant at home.  Past Medical History  Diagnosis Date  . Empty sella syndrome     In 70's  . Lupus     Inactive since 1970  . Hyperlipidemia   . Diabetes mellitus   . Hypertension   . Coronary artery disease     MI with PTCA to LAD 1992. 2V CABG 1998. PTCA/stent to prox and distal RCA 2004.  DES to high-grade LM with balloon angioplasty of ramus intermedius 12/2009 (EF55% then)     Surgical History:  Past Surgical History    Procedure Date  . Coronary artery bypass graft 10/1996     Home Meds: Medication Sig  amLODipine (NORVASC) 5 MG tablet TAKE ONE TABLET BY MOUTH EVERY DAY  aspirin 325 MG tablet Take 325 mg by mouth daily.    atorvastatin (LIPITOR) 10 MG tablet TAKE ONE TABLET BY MOUTH IN THE EVENING  clopidogrel (PLAVIX) 75 MG tablet TAKE ONE TABLET BY MOUTH EVERY DAY  fish oil-omega-3 fatty acids 1000 MG capsule Take 1 g by mouth daily.    Glucosamine 500 MG CAPS Take 1 capsule by mouth daily.   metoprolol succinate (TOPROL-XL) 25 MG 24 hr tablet TAKE ONE TABLET BY MOUTH EVERY DAY  Multiple Vitamin (MULTIVITAMIN) capsule Take 1 capsule by mouth daily.   PARoxetine (PAXIL) 20 MG tablet Take 20 mg by mouth every morning.    ZETIA 10 MG tablet TAKE ONE TABLET BY MOUTH EVERY DAY    Allergies:  Allergies  Allergen Reactions  . Iohexol      Desc: life threatning 06/30/2005     History   Social History  . Marital Status: Divorced    Spouse Name: N/A    Number of Children: N/A  . Years of Education: N/A   Occupational History  . Retired    Social History Main Topics  . Smoking status: Never Smoker   . Smokeless tobacco: Never Used  .  Alcohol Use: Yes     occasional   . Drug Use: Not on file  . Sexually Active: Not on file   Other Topics Concern  . Not on file   Social History Narrative   Has a fiance with whom she shares a Orthopt[employer] clothes.  Lives part time in [place] and in [place].   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 2 grandsons Thayer Ohm and Maisie Fus - toubled 08/15/09 - Grandsons now 28 and 26. Her daughter (their mother) is dying.Has boyfriend. No DV.Sister Francella Solian.     Family History  Problem Relation Age of Onset  . Coronary artery disease Other     Review of Systems: General: negative for chills, fever, night sweats. Some weight loss (~10lbs) over the last 4 months. +fatigue. Cardiovascular: negative for orthopnea, palpitations,  paroxysmal nocturnal dyspnea, shortness of breath or dyspnea on exertion. Otherwise see above Dermatological: negative for rash Respiratory: negative for cough or wheezing Urologic: negative for hematuria Abdominal: negative for nausea, vomiting, diarrhea, bright red blood per rectum, melena, or hematemesis Neurologic: negative for visual changes, syncope, or dizziness All other systems reviewed and are otherwise negative except as noted above.  Labs:   Lab Results  Component Value Date   WBC 5.6 03/06/2011   HGB 14.3 03/06/2011   HCT 41.6 03/06/2011   MCV 87.4 03/06/2011   PLT 219 03/06/2011    Lab 03/06/11 1017  NA 134*  K 4.5  CL 102  CO2 24  BUN 12  CREATININE 0.51  CALCIUM 8.8  PROT 6.6  BILITOT 1.1  ALKPHOS 100  ALT 23  AST 17  GLUCOSE 356*  POC troponin negative x 1  Lab Results  Component Value Date   CHOL 135 10/16/2010   HDL 50 10/16/2010   LDLCALC 69 10/16/2010   TRIG 82 10/16/2010   Radiology/Studies:  1. Chest Portable 1 View 03/06/2011  *RADIOLOGY REPORT*  Clinical Data:  left chest pain  PORTABLE CHEST - 1 VIEW  Comparison: 12/21/2009  Findings: Cardiomediastinal silhouette is stable.  Status post median sternotomy again noted.  No acute infiltrate or pleural effusion.  No pulmonary edema.  IMPRESSION: No active disease.  No significant change.  Original Report Authenticated By: Natasha Mead, M.D.    EKG: NSR 77bpm ICRBBB TWI avL, V2. Otherwise no acute changes. (TW flattening in these leads on prior EKG otherwise not significantly changed)  Physical Exam: Blood pressure 121/62, pulse 65, temperature 98 F (36.7 C), temperature source Oral, resp. rate 16, height 5\' 5"  (1.651 m), weight 210 lb (95.255 kg), SpO2 98.00%. General: Well developed, well nourished, in no acute distress. Head: Normocephalic, atraumatic, sclera non-icteric, no xanthomas, nares are without discharge.  Neck: Negative for carotid bruits. JVD not elevated. Lungs: Clear bilaterally to auscultation  without wheezes, rales, or rhonchi. Breathing is unlabored. Heart: RRR with S1 S2. No murmurs, rubs, or gallops appreciated. Abdomen: Soft, non-tender, non-distended with normoactive bowel sounds. No hepatomegaly. No rebound/guarding. No obvious abdominal masses. Msk:  Strength and tone appear normal for age. Extremities: No clubbing or cyanosis. No edema.  Distal pedal pulses are 2+ and equal bilaterally. Neuro: Alert and oriented X 3. Moves all extremities spontaneously. Psych:  Responds to questions appropriately with a flat affect.   ASSESSMENT AND PLAN:   1. Chest pain - although there is no objective evidence for ischemia, symptoms are concerning for unstable angina. Agree with heparin. Continue ASA, Plavix, BB, statin. Currently pain free. Will discuss possibility  of cath with MD.  2. Uncontrolled diabetes mellitus - this is likely contributing to her thirst and general fatigue. Will give her insulin down in the ER then place on SSI. Will need oral hypoglycemic at discharge. Needs to f/u closely with PCP to discuss implications of DM. She is not routinely checking her sugars at home.  Signed, Dayna Dunn PA-C 03/06/2011, 1:25 PM  I have seen, examined the patient, and reviewed the above assessment and plan.  Pt with known coronary artery disease and recurrent episodes of chest pain, worse yesterday and today.  On exam, she is presently stable and pain free.  I had a long discussion with patient regarding treatment options including myoview vs cath.  She is very clear that she would like to proceed with cath.  We will admit to cardiology with plans for cath in the am.  Co Sign: Hillis Range, MD 03/06/2011 2:25 PM

## 2011-03-06 NOTE — ED Notes (Signed)
Discontinued morphine and nitro.  Pt refused both as pain has decreased

## 2011-03-06 NOTE — ED Notes (Signed)
Patient states left sided chest pain onset last night continued today pain 3-4/10 pressure now compared to 5-6/10 pressure.  Denies shortness of breath.  Ax4 family member at bedside.

## 2011-03-06 NOTE — Telephone Encounter (Signed)
New msg Pt said she feel like elephant sitting on chest transferred to triage

## 2011-03-06 NOTE — ED Notes (Signed)
Pt declined Morphine for her 1/10 chest pain.  Dr. Linwood Dibbles notified of same.

## 2011-03-06 NOTE — ED Notes (Signed)
Pt in hallway with husband.  Husband mad and yelling at staff stating he wants to see a Merchandiser, retail.  Pt upset that there was a soiled bed pan in the biohazard container that was making the room smell bad.  This rn moved pt to another room that was cleaned and disposed of the soiled bed pan to the soiled utility room.

## 2011-03-06 NOTE — Telephone Encounter (Signed)
Per pt call - states she has had chest pain that has woken her from her sleep during the night.  She reports having a lot of chest pressure at this time much like when she had problems before.  She has SL nitroglycerin but has not used because it give her a "migraine".  Pt was instructed to use her SL Nitroglycerin and report to the ED via EMS for evaluation.  She states understanding.  Trish notified.

## 2011-03-06 NOTE — ED Notes (Signed)
Diet tray ordered 

## 2011-03-06 NOTE — ED Provider Notes (Addendum)
History     CSN: [id number]  Arrival date & time 03/06/11  4098   First MD Initiated Contact with Patient 03/06/11 [phone]      Chief Complaint  Patient presents with  . Chest Pain    (Consider location/radiation/quality/duration/timing/severity/associated sxs/prior treatment) Patient is a 71 y.o. female presenting with chest pain. The history is provided by the patient.  Chest Pain The chest pain began yesterday. At its most intense, the pain is at 5/10. The pain is currently at 4/10. The quality of the pain is described as pressure-like and similar to previous episodes. The pain does not radiate. Primary symptoms include nausea. Pertinent negatives for primary symptoms include no shortness of breath. She tried nitroglycerin for the symptoms.  Her past medical history is significant for CAD.  Procedure history is positive for cardiac catheterization.    Pt has history of CAD.  Pt had stents approx 2-3 years ago.  Pt states she developed pain similar to her prior cardiac disease last night at 11pm.  This am it is more of a tightness.  The symptoms resolved last night and she was able to sleep. Past Medical History  Diagnosis Date  . Empty sella syndrome     In 70's  . Lupus     Inactive since 1970  . Hyperlipidemia   . Diabetes mellitus   . Hypertension   . Coronary artery disease     Past Surgical History  Procedure Date  . Coronary artery bypass graft 10/1996    Family History  Problem Relation Age of Onset  . Coronary artery disease Other     History  Substance Use Topics  . Smoking status: Never Smoker   . Smokeless tobacco: Never Used  . Alcohol Use: Not on file    OB History    Grav Para Term Preterm Abortions TAB SAB Ect Mult Living                  Review of Systems  Respiratory: Negative for shortness of breath.   Cardiovascular: Positive for chest pain.  Gastrointestinal: Positive for nausea.  All other systems reviewed and are  negative.    Allergies  Iohexol  Home Medications   Current Outpatient Rx  Name Route Sig Dispense Refill  . AMLODIPINE BESYLATE 5 MG PO TABS  TAKE ONE TABLET BY MOUTH EVERY DAY 30 tablet 4  . ASPIRIN 325 MG PO TABS Oral Take 325 mg by mouth daily.      . ATORVASTATIN CALCIUM 10 MG PO TABS  TAKE ONE TABLET BY MOUTH IN THE EVENING 30 tablet 4  . CLOPIDOGREL BISULFATE 75 MG PO TABS  TAKE ONE TABLET BY MOUTH EVERY DAY 90 tablet 2  . OMEGA-3 FATTY ACIDS 1000 MG PO CAPS Oral Take 1 g by mouth daily.      Marland Kitchen GLUCOSAMINE 500 MG PO CAPS       . METOPROLOL SUCCINATE ER 25 MG PO TB24  TAKE ONE TABLET BY MOUTH EVERY DAY 30 tablet 4  . MULTIVITAMINS PO CAPS       . PAROXETINE HCL 20 MG PO TABS Oral Take 20 mg by mouth every morning.      Marland Kitchen ZETIA 10 MG PO TABS  TAKE ONE TABLET BY MOUTH EVERY DAY 90 each 2    BP 139/66  Pulse 82  Temp(Src) 98 F (36.7 C) (Oral)  Resp 18  SpO2 96%  Physical Exam  Nursing note and vitals reviewed. Constitutional: She appears well-developed  and well-nourished. No distress.  HENT:  Head: Normocephalic and atraumatic.  Right Ear: External ear normal.  Left Ear: External ear normal.  Eyes: Conjunctivae are normal. Right eye exhibits no discharge. Left eye exhibits no discharge. No scleral icterus.  Neck: Neck supple. No tracheal deviation present.  Cardiovascular: Normal rate, regular rhythm and intact distal pulses.   Pulmonary/Chest: Effort normal and breath sounds normal. No stridor. No respiratory distress. She has no wheezes. She has no rales.  Abdominal: Soft. Bowel sounds are normal. She exhibits no distension. There is no tenderness. There is no rebound and no guarding.  Musculoskeletal: She exhibits no edema and no tenderness.  Neurological: She is alert. She has normal strength. No sensory deficit. Cranial nerve deficit:  no gross defecits noted. She exhibits normal muscle tone. She displays no seizure activity. Coordination normal.  Skin: Skin is  warm and dry. No rash noted.  Psychiatric: She has a normal mood and affect.    ED Course  Procedures (including critical care time)  Date: 03/06/2011  Rate: 77  Rhythm: normal sinus rhythm  QRS Axis: normal  Intervals: normal  ST/T Wave abnormalities: normal  Conduction Disutrbances:Incomplete right bundle branch block  Narrative Interpretation: Inferior and anterior infarct, age undetermined  Old EKG Reviewed: unchanged  Medications  0.9 %  sodium chloride infusion (1000 mL Intravenous New Bag/Given 03/06/11 0943)  aspirin chewable tablet 324 mg (324 mg Oral Given 03/06/11 0931)  nitroGLYCERIN (NITROSTAT) SL tablet 0.4 mg (0.4 mg Sublingual Given 03/06/11 0957)     Labs Reviewed  COMPREHENSIVE METABOLIC PANEL - Abnormal; Notable for the following:    Sodium 134 (*)    Glucose, Bld 356 (*)    Albumin 3.4 (*)    All other components within normal limits  CBC  PROTIME-INR  APTT  POCT I-STAT TROPONIN I  I-STAT TROPONIN I  I-STAT TROPONIN I  I-STAT TROPONIN I   Dg Chest Portable 1 View  03/06/2011  *RADIOLOGY REPORT*  Clinical Data:  left chest pain  PORTABLE CHEST - 1 VIEW  Comparison: 12/21/2009  Findings: Cardiomediastinal silhouette is stable.  Status post median sternotomy again noted.  No acute infiltrate or pleural effusion.  No pulmonary edema.  IMPRESSION: No active disease.  No significant change.  Original Report Authenticated By: Natasha Mead, M.D.     Diagnosis: Chest pain    MDM  I reviewed the patient's old records as well as the laboratory and x-ray findings today. Patient had been having trouble with fatigue back in August of this year. Her cardiologist is concerned about the possibility of an anginal equivalent and he recommended a stress Myoview test. Patient apparently did not followup with that test and again she was encouraged to do so in September. Patient does still report that she's been having trouble with fatigue but the chest discomfort was new. I'm  concerned about the possibility of an anginal equivalent. I will consult with our cardiology for further evaluation. Patient states her current symptoms are one out of 10 however she refuses any morphine for her pain at this time.   Celene Kras, MD 03/06/11 1134  Celene Kras, MD 04/07/11 [phone]

## 2011-03-06 NOTE — ED Notes (Signed)
Per md pt can have diet coke

## 2011-03-07 ENCOUNTER — Encounter (HOSPITAL_COMMUNITY)
Admission: EM | Disposition: A | Discharge status: Home / Self Care | Payer: Self-pay | Source: Home / Self Care | Attending: Internal Medicine

## 2011-03-07 DIAGNOSIS — I2510 Atherosclerotic heart disease of native coronary artery without angina pectoris: Secondary | ICD-10-CM

## 2011-03-07 DIAGNOSIS — E119 Type 2 diabetes mellitus without complications: Secondary | ICD-10-CM

## 2011-03-07 HISTORY — PX: LEFT HEART CATHETERIZATION WITH CORONARY ANGIOGRAM: SHX5451

## 2011-03-07 LAB — BASIC METABOLIC PANEL
BUN: 14 mg/dL (ref 6–23)
CO2: 20 mEq/L (ref 19–32)
Chloride: 101 mEq/L (ref 96–112)
Glucose, Bld: 406 mg/dL — ABNORMAL HIGH (ref 70–99)
Potassium: 4.3 mEq/L (ref 3.5–5.1)
Sodium: 132 mEq/L — ABNORMAL LOW (ref 135–145)

## 2011-03-07 LAB — URINALYSIS, ROUTINE W REFLEX MICROSCOPIC
Hgb urine dipstick: NEGATIVE
Nitrite: NEGATIVE
Specific Gravity, Urine: 1.026 (ref 1.005–1.030)
Urobilinogen, UA: 0.2 mg/dL (ref 0.0–1.0)
pH: 5 (ref 5.0–8.0)

## 2011-03-07 LAB — GLUCOSE, CAPILLARY
Glucose-Capillary: 225 mg/dL — ABNORMAL HIGH (ref 70–99)
Glucose-Capillary: 278 mg/dL — ABNORMAL HIGH (ref 70–99)
Glucose-Capillary: 361 mg/dL — ABNORMAL HIGH (ref 70–99)
Glucose-Capillary: 458 mg/dL — ABNORMAL HIGH (ref 70–99)

## 2011-03-07 LAB — LIPID PANEL
Cholesterol: 135 mg/dL (ref 0–200)
LDL Cholesterol: 74 mg/dL (ref 0–99)
VLDL: 12 mg/dL (ref 0–40)

## 2011-03-07 LAB — URINE MICROSCOPIC-ADD ON

## 2011-03-07 LAB — CARDIAC PANEL(CRET KIN+CKTOT+MB+TROPI)
CK, MB: 1.6 ng/mL (ref 0.3–4.0)
CK, MB: 1.5 ng/mL (ref 0.3–4.0)
Total CK: 44 U/L (ref 7–177)
Troponin I: <0.3 ng/mL (ref <0.30)

## 2011-03-07 SURGERY — LEFT HEART CATHETERIZATION WITH CORONARY ANGIOGRAM
Anesthesia: LOCAL

## 2011-03-07 MED ORDER — SODIUM CHLORIDE 0.9 % IV SOLN
1.0000 mL/kg/h | INTRAVENOUS | Status: AC
  Administered 2011-03-07: 1 mL/kg/h via INTRAVENOUS

## 2011-03-07 MED ORDER — MIDAZOLAM HCL 2 MG/2ML IJ SOLN
INTRAMUSCULAR | Status: AC
  Filled 2011-03-07: qty 2

## 2011-03-07 MED ORDER — LIDOCAINE HCL (PF) 1 % IJ SOLN
INTRAMUSCULAR | Status: AC
  Filled 2011-03-07: qty 30

## 2011-03-07 MED ORDER — HEPARIN (PORCINE) IN NACL 2-0.9 UNIT/ML-% IJ SOLN
INTRAMUSCULAR | Status: AC
  Filled 2011-03-07: qty 2000

## 2011-03-07 MED ORDER — FENTANYL CITRATE 0.05 MG/ML IJ SOLN
INTRAMUSCULAR | Status: AC
  Filled 2011-03-07: qty 2

## 2011-03-07 MED ORDER — INSULIN GLARGINE 100 UNIT/ML ~~LOC~~ SOLN
10.0000 [IU] | Freq: Every day | SUBCUTANEOUS | Status: DC
  Administered 2011-03-07: 10 [IU] via SUBCUTANEOUS
  Filled 2011-03-07: qty 3

## 2011-03-07 MED ORDER — INSULIN ASPART 100 UNIT/ML ~~LOC~~ SOLN
0.0000 [IU] | SUBCUTANEOUS | Status: DC
  Administered 2011-03-07: 6 [IU] via SUBCUTANEOUS
  Administered 2011-03-08: 4 [IU] via SUBCUTANEOUS
  Filled 2011-03-07: qty 3

## 2011-03-07 NOTE — Interval H&P Note (Signed)
History and Physical Interval Note:  03/07/2011 3:48 PM  Casey [name]  has presented today for surgery, with the diagnosis of Chest pain  The various methods of treatment have been discussed with the patient and family. After consideration of risks, benefits and other options for treatment, the patient has consented to  Procedure(s): LEFT HEART CATHETERIZATION WITH CORONARY ANGIOGRAM as a surgical intervention .  The patients' history has been reviewed, patient examined, no change in status, stable for surgery.  I have reviewed the patients' chart and labs.  Questions were answered to the patient's satisfaction.     Theron Arista [facility]

## 2011-03-07 NOTE — Consult Note (Signed)
Family Medicine Teaching Service Attending Note  I interviewed and examined patient Casey [name] and reviewed their tests and x-rays.  I discussed with Dr. Katrinka Blazing and reviewed their note for today.  I agree with their assessment and plan.     Additionally  Thank you for consulting Casey [name].   Casey [name] has had diet controlled diabetes for at least 2 years.  Her blood sugar have likely been elevated the last month given her polyuria and dipsia.    Treat with insulin while undergoing CAD evaluation in the hospital and if she responds well then will initiate oral diabetes treatment We will follow with you  CHAMBLISS,MARSHALL L

## 2011-03-07 NOTE — Op Note (Signed)
Cardiac Catheterization Procedure Note  Name: Casey [name] MRN: [id number] DOB: 01/02/1941  Procedure: Left Heart Cath, Selective Coronary Angiography, LV angiography, saphenous vein graft angiography, LIMA graft angiography.  Indication: 71 year old white female with history of prior coronary bypass surgery and multiple coronary interventions. She presents with new onset of chest pain and poorly controlled diabetes.   Procedural details: The right groin was prepped, draped, and anesthetized with 1% lidocaine. Using modified Seldinger technique, a 5 French sheath was introduced into the right femoral artery. Standard Judkins catheters were used for coronary angiography and left ventriculography. Catheter exchanges were performed over a guidewire. There were no immediate procedural complications. The patient was transferred to the post catheterization recovery area for further monitoring.  Procedural Findings: Hemodynamics:  AO 128/58 mmHg LV 126/16 mmHg   Coronary angiography: Coronary dominance: right  Left mainstem: This is widely patent at the prior stent site extending into the LAD. No significant obstruction is noted.  Left anterior descending (LAD): Widely patent in the stented segment proximally. There is a 40-50% stenosis in the mid vessel.  Left circumflex (LCx): This is a small-caliber vessel that basically consist of an AV groove branch. The marginal branch is occluded.  Right coronary artery (RCA): This is a large dominant vessel. There are extensive stents throughout the vessel. There is 30% segmental disease in the mid vessel. Otherwise nonobstructive disease.  The saphenous vein graft to the obtuse marginal vessel is widely patent. There is 30-40% disease in the distal vein graft. There is excellent runoff.  The LIMA graft to the LAD is atretic distally.  Left ventriculography: Left ventricular systolic function is normal, LVEF is estimated at 55-65%, there is no  significant mitral regurgitation   Final Conclusions:   1. Single vessel obstructive coronary disease involving the native left circumflex coronary. There is still excellent patency of the extensive stents in the right coronary as well as the stent in the left main coronary extending into the LAD. 2. Patent saphenous vein graft to the first obtuse marginal vessel which essentially supplies the entire circumflex. 3. The atretic LIMA graft distally. 4. Normal left ventricular function.  Recommendations: Recommend continue medical therapy. We need to achieve better glucose control. Compared to her prior angiogram there is no significant progression of disease.  Theron Arista [facility] 03/07/2011, 4:21 PM

## 2011-03-07 NOTE — Progress Notes (Addendum)
Inpatient Diabetes Program Recommendations  AACE/ADA: New Consensus Statement on Inpatient Glycemic Control (2009)  Target Ranges:  Prepandial:   less than 140 mg/dL      Peak postprandial:   less than 180 mg/dL (1-2 hours)      Critically ill patients:  140 - 180 mg/dL   Reason for Visit: Note CBG's greater than 300 mg/dL. A1C=7.5 % in August 2012.  Patient was on no diabetes medications prior to admission. Would be helpful to know recent A1C.    Inpatient Diabetes Program Recommendations Insulin - Basal: May benefit from Lantus 15 units daily HgbA1C: Please check to assess prehospitalization glycemic control.  Note: Please order outpatient diabetes education prior to discharge.  Spoke to patient.  She states that she does not have meter at home and has not been following a specific diet at home.  Seen by MD.  A1C ordered.  Will need outpatient follow-up.  Left her information regarding diabetes basics and basic carbohydrate information. Will also need meter Rx. At discharge.

## 2011-03-07 NOTE — Consult Note (Signed)
Family Medicine Teaching [facility] Consult Note  Patient name: Casey [name] Medical record number: [id number] Date of birth: 03/12/1940 Age: 71 y.o. Gender: female  Primary Care Provider: Carney Living, MD, MD  We were consulted for diabetes management. History of Present Illness: Casey [name] is a 71 y.o. year old female presenting with  hx CAD, HTN, HL, DM that was diet controlled presented to [facility] with complaints of chest pain on March 05, 2010. Cardiology is primary service and is monitoring patient who is having a catheterization done today. Patient's troponins have been negative EKG was without any acute changes during admission though patient was found to have a capillary blood glucose of 396. In regards to her diabetes patient endorses recent increase in thirst for 1 month as well as fatigue and exhaustion and poly-urea  A1C was 7.5 in August. Patient denies any visual changes any sweating, fever or chills. Patient has not checked her blood sugars stating that her primary care provider stated there was no need act in August.  Patient Active Problem List  Diagnoses  . DIABETES MELLITUS II, UNCOMPLICATED  . HYPERLIPIDEMIA NEC/NOS  . DEPRESSIVE DISORDER, NOS  . ESSENTIAL HYPERTENSION  . CORONARY, ARTERIOSCLEROSIS  . SLE  . Fatigue   Past Medical History: Past Medical History  Diagnosis Date  . Empty sella syndrome     In 70's  . Lupus     Inactive since 1970  . Hyperlipidemia   . Hypertension   . Coronary artery disease     MI with PTCA to LAD 1992. 2V CABG 1998. PTCA/stent to prox and distal RCA 2004.  DES to high-grade LM with balloon angioplasty of ramus intermedius 12/2009 (EF55% then)  . Submandibular abscess 2008  . Angina   . Shortness of breath   . Arthritis   . Myocardial infarction   . Diabetes mellitus     Past Surgical History: Past Surgical History  Procedure Date  . Coronary artery bypass graft 10/1996    Social History: History     Social History  . Marital Status: Divorced    Spouse Name: N/A    Number of Children: N/A  . Years of Education: N/A   Occupational History  . Retired    Social History Main Topics  . Smoking status: Never Smoker   . Smokeless tobacco: Never Used  . Alcohol Use: Yes     occasional   . Drug Use: No  . Sexually Active: Not Currently    Birth Control/ Protection: Post-menopausal   Other Topics Concern  . None   Social History Narrative   Has a fiance with whom she shares a Teacher, early years/pre.  Lives part time in [place] and in [place].   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 2 grandsons Thayer Ohm and Maisie Fus - toubled 08/15/09 - Grandsons now 28 and 26. Her daughter (their mother) is dying.Has boyfriend. No DV.Sister Francella Solian.    Family History: Family History  Problem Relation Age of Onset  . Coronary artery disease Father     Sister also had ischemic heart disease  . Hypertension Mother     Allergies: Allergies  Allergen Reactions  . Iohexol      Desc: life threatning 06/30/2005     Current Facility-Administered Medications  Medication Dose Route Frequency Provider Last Rate Last Dose  . 0.9 %  sodium chloride infusion  250 mL Intravenous PRN Dayna N Dunn, PA      . 0.9 %  sodium chloride infusion   Intravenous Continuous Laurann Montana, PA 100 mL/hr at 03/07/11 0647    . acetaminophen (TYLENOL) tablet 650 mg  650 mg Oral Q4H PRN Dayna N Dunn, PA      . ALPRAZolam Prudy Feeler) tablet 0.25 mg  0.25 mg Oral BID PRN Dayna N Dunn, PA      . amLODipine (NORVASC) tablet 5 mg  5 mg Oral Daily Dayna N Dunn, PA   5 mg at 03/07/11 1052  . aspirin chewable tablet 324 mg  324 mg Oral Pre-Cath Dayna N Dunn, PA   324 mg at 03/07/11 1610  . aspirin EC tablet 81 mg  81 mg Oral Daily [place] R Smith, PHARMD      . clopidogrel (PLAVIX) tablet 75 mg  75 mg Oral Q breakfast Laurann Montana, PA   75 mg at 03/06/11 2033  . diphenhydrAMINE (BENADRYL) injection 25 mg   25 mg Intravenous On Call Dayna N Dunn, PA      . ezetimibe (ZETIA) tablet 10 mg  10 mg Oral Daily Dayna N Dunn, PA   10 mg at 03/07/11 1053  . famotidine (PEPCID) IVPB 20 mg  20 mg Intravenous On Call Dayna N Dunn, PA      . heparin ADULT infusion 100 units/ml (25000 units/250 ml)  950 Units/hr Intravenous Continuous Anh P Pham, PHARMD 9.5 mL/hr at 03/07/11 0647 950 Units/hr at 03/07/11 0647  . insulin aspart (novoLOG) injection 0-5 Units  0-5 Units Subcutaneous QHS Darrol Jump, PA   5 Units at 03/06/11 2220  . insulin aspart (novoLOG) injection 0-9 Units  0-9 Units Subcutaneous TID WC Darrol Jump, PA   5 Units at 03/07/11 1242  . insulin aspart (novoLOG) injection 4 Units  4 Units Subcutaneous Once [employer], PA   4 Units at 03/06/11 1645  . metoprolol succinate (TOPROL-XL) 24 hr tablet 25 mg  25 mg Oral Daily Dayna N Dunn, PA   25 mg at 03/07/11 1052  . mulitivitamin with minerals tablet 1 tablet  1 tablet Oral Daily Dayna N Dunn, PA   1 tablet at 03/07/11 1053  . nitroGLYCERIN (NITROSTAT) SL tablet 0.4 mg  0.4 mg Sublingual Q5 min PRN Dayna N Dunn, PA      . omega-3 acid ethyl esters (LOVAZA) capsule 1 g  1 g Oral Daily Dayna N Dunn, PA   1 g at 03/07/11 1053  . ondansetron (ZOFRAN) injection 4 mg  4 mg Intravenous Q6H PRN Laurann Montana, PA      . PARoxetine (PAXIL) tablet 20 mg  20 mg Oral Q0700 Dayna N Dunn, PA   20 mg at 03/07/11 9604  . predniSONE (DELTASONE) tablet 60 mg  60 mg Oral Pre-Cath Dayna N Dunn, PA   60 mg at 03/06/11 2218   Followed by  . predniSONE (DELTASONE) tablet 60 mg  60 mg Oral Pre-Cath Dayna N Dunn, PA   60 mg at 03/07/11 1049  . rosuvastatin (CRESTOR) tablet 20 mg  20 mg Oral Daily Dayna N Dunn, PA   20 mg at 03/07/11 1053  . sodium chloride 0.9 % injection 3 mL  3 mL Intravenous Q12H Dayna N Dunn, PA   3 mL at 03/07/11 1049  . sodium chloride 0.9 % injection 3 mL  3 mL Intravenous PRN Dayna N Dunn, PA      . zolpidem (AMBIEN) tablet 5 mg  5 mg Oral QHS  PRN Laurann Montana, PA      .  DISCONTD: 0.9 %  sodium chloride infusion  20 mL Intravenous Continuous Celene Kras, MD 20 mL/hr at 03/06/11 0943 1,000 mL at 03/06/11 0943  . DISCONTD: aspirin EC tablet 81 mg  81 mg Oral Daily Dayna N Dunn, PA      . DISCONTD: insulin aspart (novoLOG) injection 0-9 Units  0-9 Units Subcutaneous TID WC Dayna N Dunn, PA      . DISCONTD: insulin aspart (novoLOG) injection 8 Units  8 Units Subcutaneous Once Laurann Montana, PA         Physical Exam: General: Well developed, well nourished, in no acute distress.  Head: Normocephalic, atraumatic, sclera non-icteric, mild flushing of the cheeks Neck: Negative for carotid bruits.  Lungs: Clear bilaterally to auscultation without wheezes, rales, or rhonchi. Breathing is unlabored.  Heart: RRR with S1 S2. No murmurs, rubs, or gallops appreciated.  Abdomen: Soft, non-tender, non-distended with normoactive bowel sounds. No hepatomegaly. No rebound/guarding. N  Msk: Strength and tone appear normal for age.  Extremities:. No edema. Distal pedal pulses are 2+ and equal bilaterally.  Neuro: Alert and oriented X 3. Moves all extremities spontaneously.  Psych: Responds to questions appropriately with a flat affect.   Labs and Imaging: Lab Results  Component Value Date/Time   NA 132* 03/07/2011  5:40 AM   K 4.3 03/07/2011  5:40 AM   CL 101 03/07/2011  5:40 AM   CO2 20 03/07/2011  5:40 AM   BUN 14 03/07/2011  5:40 AM   CREATININE 0.48* 03/07/2011  5:40 AM   CREATININE 0.50 10/16/2010 11:34 AM   GLUCOSE 406* 03/07/2011  5:40 AM   Lab Results  Component Value Date   WBC 5.6 03/06/2011   HGB 14.3 03/06/2011   HCT 41.6 03/06/2011   MCV 87.4 03/06/2011   PLT 219 03/06/2011   Lab Results  Component Value Date   HGBA1C 7.5 10/16/2010    Lab 03/07/11 0540 03/06/11 2332 03/06/11 1841  CKTOTAL 44 45 47  TROPONINI <0.30 <0.30 <0.30  TROPONINT -- -- --  CKMBINDEX -- -- --    Lab 03/07/11 0540  CHOL 135  TRIG 61  HDL 49      Assessment  and Plan: Casey [name] is a 71 y.o. year old female presenting with angina and uncontrolled diabetes. 1. chest pain-patient is scheduled to have a catheterization done troponins have been negative x3 EKG shows no ischemic changes but symptoms are very concerning for unstable angina. Cardiology is primary and we'll continue with their expertise. 2. uncontrolled diabetes-patient has a history of this. Patient was diet-controlled at last visit in August with a hemoglobin A1c of 7.5 at that time patient has been doing diet-controlled but has seen with new sugars in patient's history last month of polydipsia and polyuria likely she's been running significantly higher blood sugars. At this time we will get an A1c will start patient on sensitive sliding scale and probably will do a long acting insulin such as Lantus. Patient has never been on oral therapies for her diabetes for and this could be contemplated for outpatient therapy. At this time though we'll not start anything like glipizide or metformin secondary to patient having contrast soon. We will also hold the Lantus at this point with patient being n.p.o. until after her catheterization. 3. hyponatremia-secondary to patient's hyperglycemia when corrected is normal. 4. Hypertension-patient is controlled with medications at this time. Patient though would benefit from a low-dose ACE inhibitor they could be added. 5. Hyperlipidemia- patient is  doing very well with last fasting lipid panel we'll make no changes at this time. 6. FEN/GI- continue IV fluids at 100 cc an hour until patient is taking by mouth intake patient is on a heparin drip at this time. Patient will need a heart healthy car modified diet 3. Prophylaxis: As stated above 4. Disposition: Pending cardiology intervention and better control of her blood sugars. Appreciate this consult

## 2011-03-07 NOTE — Progress Notes (Signed)
ANTICOAGULATION CONSULT NOTE - Initial Consult  Pharmacy Consult for Heparin Indication: chest pain/ACS  Allergies  Allergen Reactions  . Iohexol      Desc: life threatning 06/30/2005     Patient Measurements: Height: 5\' 5"  (165.1 cm) Weight: 210 lb (95.255 kg) IBW/kg (Calculated) : 57   Vital Signs: Temp: 98.1 F (36.7 C) (01/04 0500) Temp src: Oral (01/04 0500) BP: 125/71 mmHg (01/04 0500) Pulse Rate: 76  (01/04 0500)  Labs:  Basename 03/07/11 0540 03/06/11 2332 03/06/11 1842 03/06/11 1841 03/06/11 1017  HGB -- -- -- -- 14.3  HCT -- -- -- -- 41.6  PLT -- -- -- -- 219  APTT -- -- -- -- 28  LABPROT -- -- -- -- 14.2  INR -- -- -- -- 1.08  HEPARINUNFRC 0.39 -- 0.32 -- --  CREATININE 0.48* -- -- -- 0.51  CKTOTAL 44 45 -- 47 --  CKMB 1.5 1.6 -- 1.6 --  TROPONINI <0.30 <0.30 -- <0.30 --   Estimated Creatinine Clearance: 75.8 ml/min (by C-G formula based on Cr of 0.48).   Assessment: Patient is a 71 y.o F admitted to the ED with c/o CP and on heparin at goal. Patient noted for cath today.  Goal of Therapy:  Heparin level 0.3-0.7 units/ml   Plan:  Continue IV heparin at 950 units/hr follow post cath.   Benny Lennert 03/07/2011,8:26 AM

## 2011-03-07 NOTE — Progress Notes (Signed)
For cath study today.  Sugars are high.  Will ask [facility] to see as a consult as Dr. Deretha Emory is her primary.  She will need treatment for her diabetes in conjunction with her current.  Steroids have also raised her sugar.  Shawnie Pons 12:45 PM 03/07/2011

## 2011-03-08 ENCOUNTER — Other Ambulatory Visit: Payer: Self-pay | Admitting: Family Medicine

## 2011-03-08 DIAGNOSIS — R079 Chest pain, unspecified: Secondary | ICD-10-CM

## 2011-03-08 DIAGNOSIS — E782 Mixed hyperlipidemia: Secondary | ICD-10-CM

## 2011-03-08 DIAGNOSIS — I2510 Atherosclerotic heart disease of native coronary artery without angina pectoris: Principal | ICD-10-CM

## 2011-03-08 LAB — GLUCOSE, CAPILLARY
Glucose-Capillary: 312 mg/dL — ABNORMAL HIGH (ref 70–99)
Glucose-Capillary: 315 mg/dL — ABNORMAL HIGH (ref 70–99)
Glucose-Capillary: 372 mg/dL — ABNORMAL HIGH (ref 70–99)

## 2011-03-08 LAB — HEMOGLOBIN A1C
Hgb A1c MFr Bld: 12.8 % — ABNORMAL HIGH (ref <5.7)
Mean Plasma Glucose: 321 mg/dL — ABNORMAL HIGH (ref <117)

## 2011-03-08 MED ORDER — METFORMIN HCL 500 MG PO TABS: 500.0000 mg | ORAL_TABLET | Freq: Two times a day (BID) | ORAL | Status: DC

## 2011-03-08 MED ORDER — BD LANCET ULTRAFINE 30G MISC: Status: DC

## 2011-03-08 MED ORDER — GLUCOSE BLOOD VI STRP: ORAL_STRIP | Status: DC

## 2011-03-08 MED ORDER — INSULIN GLARGINE 100 UNIT/ML ~~LOC~~ SOLN: 15.0000 [IU] | Freq: Every day | SUBCUTANEOUS | Status: DC

## 2011-03-08 MED ORDER — INSULIN ASPART 100 UNIT/ML ~~LOC~~ SOLN
0.0000 [IU] | Freq: Three times a day (TID) | SUBCUTANEOUS | Status: DC
  Administered 2011-03-08: 15 [IU] via SUBCUTANEOUS

## 2011-03-08 MED ORDER — ASPIRIN 81 MG PO TBEC: 81.0000 mg | DELAYED_RELEASE_TABLET | Freq: Every day | ORAL | Status: DC

## 2011-03-08 MED ORDER — INSULIN ASPART 100 UNIT/ML ~~LOC~~ SOLN: 0.0000 [IU] | Freq: Every day | SUBCUTANEOUS | Status: DC

## 2011-03-08 MED ORDER — NITROGLYCERIN 0.4 MG SL SUBL: 0.4000 mg | SUBLINGUAL_TABLET | SUBLINGUAL | Status: AC | PRN

## 2011-03-08 MED ORDER — BLOOD GLUCOSE METER KIT: PACK | Status: AC

## 2011-03-08 NOTE — Discharge Summary (Signed)
See rounding note as well.

## 2011-03-08 NOTE — Progress Notes (Signed)
03/08/11 pt was  Discharged to home with  Coverage for increase cgb's  Until she can return to her MD. All follow up care signs of increase and decrease cbg,  Discharges orders understood meds, diet and activity knows how to check her cbg , monitor ordered/ her pharmacy along with her insulin pen and she feels comfortable giving herself insulin  [address] RN,BSN

## 2011-03-08 NOTE — Progress Notes (Signed)
Subjective: Doing well, pain free.  Wants to go home.  Objective: Vital signs in last 24 hours: Temp:  [97.8 F (36.6 C)-98.7 F (37.1 C)] 97.9 F (36.6 C) (01/05 0500) Pulse Rate:  [70-84] 80  (01/05 0500) Resp:  [18-20] 18  (01/05 0500) BP: (110-134)/(44-90) 131/88 mmHg (01/05 0500) SpO2:  [95 %-96 %] 96 % (01/05 0500) Weight change:  Last BM Date: 03/05/11  Intake/Output from previous day: 01/04 0701 - 01/05 0700 In: 1162 [I.V.:1162] Out: -  Intake/Output this shift:    General appearance: alert, cooperative and appears stated age Head: Normocephalic, without obvious abnormality, atraumatic Eyes: EOMI, sclera non-ictaric Nose: no discharge Neck: no JVD and supple, symmetrical, trachea midline Resp: clear to auscultation bilaterally Cardio: regular rate and rhythm, S1, S2 normal, no murmur, click, rub or gallop GI: soft, non-tender; bowel sounds normal; no masses,  no organomegaly Extremities: extremities normal, atraumatic, no cyanosis or edema Pulses: 2+ and symmetric Skin: Skin color, texture, turgor normal. No rashes or lesions  Lab Results:  Basename 03/06/11 1017  WBC 5.6  HGB 14.3  HCT 41.6  PLT 219   BMET  Basename 03/07/11 0540 03/06/11 1017  NA 132* 134*  K 4.3 4.5  CL 101 102  CO2 20 24  GLUCOSE 406* 356*  BUN 14 12  CREATININE 0.48* 0.51  CALCIUM 8.7 8.8    Basename 03/08/11 0751 03/08/11 0205 03/07/11 2107 03/07/11 1718 03/07/11 1629 03/07/11 1344 03/07/11 1129 03/07/11 0725 03/07/11 0004  GLUCAP 315* 312* 458* 238* 225* 257* 278* 352* 361*    Studies/Results: Dg Chest Portable 1 View  03/06/2011  *RADIOLOGY REPORT*  Clinical Data:  left chest pain  PORTABLE CHEST - 1 VIEW  Comparison: 12/21/2009  Findings: Cardiomediastinal silhouette is stable.  Status post median sternotomy again noted.  No acute infiltrate or pleural effusion.  No pulmonary edema.  IMPRESSION: No active disease.  No significant change.  Original Report Authenticated By:  Natasha Mead, M.D.    Medications: I have reviewed the patient's current medications.  Assessment/Plan: 1) CAD: Stable per cath report.  Medical management.  Will defer any changes to cardiology at this time.  2) DM: Pt has previously had diet controlled DM, now with apparent progression.  Lantus 10 units started last PM.  Pt would benefit from metformin and likely glipizide.  Cannot start metformin for an additional 24 hours due to contrast administration with cath.  Would plan to start 500mg  BID tomorrow with increase to 1000mg  BID in 1-2 weeks time (to minimize GI side effects).  Will also change from sensitive to regular sliding scale.  If the patient is still in house this evening, would plan to increase Lantus to 15 units as the patient received 10 units of SSI overnight and is still high this morning.  I am hesitant to add glipizide while the patient is still on SSI as this tends to cause significant hypoglycemia is patients who have only recently begun requiring insulin.  If the patient were discharged today, I would recommend sending her home on 15 units Lantus QHS and starting 500mg  Metformin tomorrow evening.   3) HTN: BP currently well controlled on current therapy.  No immediate changes, although low dose ACEI would be appropriate considering DM.  4) HLD: On crestor 20 with good last FLP.  Continue current therapy.  5) Dispo: Per cardiology.  We will continue to follow the patient and manage glucose control as long as the patient is admitted.  We would not  recommend keeping the patient admitted solely for glucose control.  If discharged over the weekend, she can call our office Monday morning for an appointment to have continued titration of her medications early next week.  Our office appointment line is [phone].   LOS: 2 days   Jerrie Gullo 03/08/2011, 8:57 AM [phone]

## 2011-03-08 NOTE — Progress Notes (Signed)
   Subjective: No chest pain or shortness of breath, eager to go home.   Objective: Temp:  [97.8 F (36.6 C)-98.7 F (37.1 C)] 97.9 F (36.6 C) (01/05 0500) Pulse Rate:  [70-84] 78  (01/05 1110) Resp:  [18-20] 18  (01/05 0500) BP: (110-134)/(44-88) 122/60 mmHg (01/05 1110) SpO2:  [95 %-96 %] 96 % (01/05 0500)  I/O last 3 completed shifts: In: 1162 [I.V.:1162] Out: -   Telemetry - Sinus rhythm.  Exam -   General - NAD.  Lungs - Clear to auscultation.  Cardiac - Regular rate and rhythm, no gallop.  Extremities - Groin site stable.  Testing -   Lab Results  Component Value Date   WBC 5.6 03/06/2011   HGB 14.3 03/06/2011   HCT 41.6 03/06/2011   MCV 87.4 03/06/2011   PLT 219 03/06/2011    Lab Results  Component Value Date   CREATININE 0.48* 03/07/2011   BUN 14 03/07/2011   NA 132* 03/07/2011   K 4.3 03/07/2011   CL 101 03/07/2011   CO2 20 03/07/2011    Lab Results  Component Value Date   CKTOTAL 44 03/07/2011   CKMB 1.5 03/07/2011   TROPONINI <0.30 03/07/2011    Current Medications    . amLODipine  5 mg Oral Daily  . aspirin EC  81 mg Oral Daily  . clopidogrel  75 mg Oral Q breakfast  . ezetimibe  10 mg Oral Daily  . famotidine (PEPCID) IV  20 mg Intravenous On Call  . fentaNYL      . heparin      . insulin aspart  0-15 Units Subcutaneous TID WC  . insulin aspart  0-5 Units Subcutaneous QHS  . insulin glargine  15 Units Subcutaneous QHS  . lidocaine      . metoprolol succinate  25 mg Oral Daily  . midazolam      . midazolam      . mulitivitamin with minerals  1 tablet Oral Daily  . omega-3 acid ethyl esters  1 g Oral Daily  . PARoxetine  20 mg Oral Q0700  . rosuvastatin  20 mg Oral Daily  . sodium chloride  3 mL Intravenous Q12H  . DISCONTD: insulin aspart  0-5 Units Subcutaneous QHS  . DISCONTD: insulin aspart  0-5 Units Subcutaneous Custom  . DISCONTD: insulin aspart  0-9 Units Subcutaneous TID WC  . DISCONTD: insulin glargine  10 Units Subcutaneous QHS     Assessment:  1. Chest pain syndrome with normal cardiac markers. Repeat cardiac catheterization shows relatively stable anatomy. Medical therapy planned as per Dr. Swaziland.  2. Multivessel CAD with obstructive disease in the circumflex, patent SVG to the obtuse marginal, patency of RCA stents, and patency of left main stent extending into the LAD. LIMA graft is atretic. LV function normal.  3. Type 2 diabetes mellitus, glucose not optimally controlled. Followed by FPTS. Recommendations noted and appreciated. Outpatient followup to be arranged.  4. Hypertension.  5. Hyperlipidemia, on statin therapy.  Plan:  For discharge home today. Plan to continue present medical regimen, modifying her outpatient diuretic regimen as per recommendations of FPTS. She will need followup as an outpatient with family practice, and should be seen back in the [place] office within the next 2 weeks, either with Mr. Alben Spittle or Dr. Clifton James.  Jonelle Sidle, M.D., F.A.C.C.

## 2011-03-08 NOTE — Discharge Summary (Signed)
Physician Discharge Summary  Patient ID: Casey [name] MRN: [id number] DOB/AGE: [birth year] 71 y.o.  Admit date: 03/06/2011 Discharge date: 03/08/2011  Primary Cardiologist: Earney Hamburg, MD  Primary Discharge Diagnosis: 1  CAD, single vessel  A  continued medical therapy recommended  Secondary Discharge Diagnoses: Empty sella syndrome   In 70's  Lupus   Inactive since 1970   Hyperlipidemia  Diabetes mellitus  Hypertension   Coronary artery disease   MI with PTCA to LAD 1992. 2V CABG 1998. PTCA/stent to prox and distal RCA 2004. DES to high-grade LM with   balloon angioplasty of ramus intermedius 12/2009 (EF55% then)  Reason for Admission: 71 year old female, with known CAD, and multiple cardiac risk factors, including DM, who presented to the ED with complaint of chest pain.  Procedures: Cardiac catheterization  Hospital Course: The patient ruled out for MI with negative cardiac markers, and was referred for cardiac catheterization. This yielded single vessel CAD involving the native CFX (100% OM), patent stents in the RCA and in the LM extending into the LAD; patent S-OM1 graft; atretic LIMA graft, and NL LVF.  Dr. Swaziland felt that there was no significant progression of disease, as compared to prior study. He recommended continued medical therapy, with particular emphasis on better glucose control.  Patient was cleared for discharge the following morning, in hemodynamically stable condition. There were no noted complications of the R groin incision site.  Discharge Vitals: Blood pressure 144/79, pulse 76, temperature 98.8 F (37.1 C), temperature source Oral, resp. rate 16, height 5\' 5"  (1.651 m), weight 210 lb (95.255 kg), SpO2 97.00%.  Labs:   Lab Results  Component Value Date   WBC 5.6 03/06/2011   HGB 14.3 03/06/2011   HCT 41.6 03/06/2011   MCV 87.4 03/06/2011   PLT 219 03/06/2011    Lab 03/07/11 0540 03/06/11 1017  NA 132* --  K 4.3 --  CL 101 --  CO2 20 --  BUN 14  --  CREATININE 0.48* --  CALCIUM 8.7 --  PROT -- 6.6  BILITOT -- 1.1  ALKPHOS -- 100  ALT -- 23  AST -- 17  GLUCOSE 406* --   Lab Results  Component Value Date   CKTOTAL 44 03/07/2011   CKMB 1.5 03/07/2011   TROPONINI <0.30 03/07/2011    Lab Results  Component Value Date   CHOL 135 03/07/2011   CHOL 135 10/16/2010   CHOL 125 06/07/2008   Lab Results  Component Value Date   HDL 49 03/07/2011   HDL 50 10/16/2010   HDL 48 4/0/9811   Lab Results  Component Value Date   LDLCALC 74 03/07/2011   LDLCALC 69 10/16/2010   LDLCALC 62 11/08/2008   Lab Results  Component Value Date   TRIG 61 03/07/2011   TRIG 82 10/16/2010   TRIG 98 06/07/2008   Lab Results  Component Value Date   CHOLHDL 2.8 03/07/2011   CHOLHDL 2.7 10/16/2010   CHOLHDL 2.6 Ratio 06/07/2008   No results found for this basename: LDLDIRECT     DISPOSITION: Stable condition  FOLLOW UP PLANS AND APPOINTMENTS: Discharge Orders    Future Orders Please Complete By Expires   Diet - low sodium heart healthy      Increase activity slowly        Follow-up Information    Follow up with CHAMBLISS,MARSHALL L, MD. Make an appointment in 2 weeks.      Follow up with MCALHANY,CHRISTOPHER, MD in 2 weeks. (office will call with  appointment)    Contact information:   [facility] [address] [place] [address] [phone]          DISCHARGE MEDICATIONS: Current Discharge Medication List    START taking these medications   Details  aspirin EC 81 MG EC tablet Take 1 tablet (81 mg total) by mouth daily.    nitroGLYCERIN (NITROSTAT) 0.4 MG SL tablet Place 1 tablet (0.4 mg total) under the tongue every 5 (five) minutes as needed for chest pain. Qty: 25 tablet, Refills: 1      CONTINUE these medications which have NOT CHANGED   Details  amLODipine (NORVASC) 5 MG tablet TAKE ONE TABLET BY MOUTH EVERY DAY Qty: 30 tablet, Refills: 4    atorvastatin (LIPITOR) 10 MG tablet TAKE ONE TABLET BY  MOUTH IN THE EVENING Qty: 30 tablet, Refills: 4    clopidogrel (PLAVIX) 75 MG tablet TAKE ONE TABLET BY MOUTH EVERY DAY Qty: 90 tablet, Refills: 2    fish oil-omega-3 fatty acids 1000 MG capsule Take 1 g by mouth daily.     Glucosamine 500 MG CAPS Take 1 capsule by mouth daily.     metoprolol succinate (TOPROL-XL) 25 MG 24 hr tablet TAKE ONE TABLET BY MOUTH EVERY DAY Qty: 30 tablet, Refills: 4    Multiple Vitamin (MULTIVITAMIN) capsule Take 1 capsule by mouth daily.     PARoxetine (PAXIL) 20 MG tablet Take 20 mg by mouth every morning.      ZETIA 10 MG tablet TAKE ONE TABLET BY MOUTH EVERY DAY Qty: 90 each, Refills: 2    Blood Glucose Monitoring Suppl (BLOOD GLUCOSE METER) kit Use as instructed Qty: 1 each, Refills: 0      STOP taking these medications     aspirin 325 MG tablet         BRING ALL MEDICATIONS WITH YOU TO FOLLOW UP APPOINTMENTS  Time spent with patient to include physician time: Greater than 30 minutes, including physician time.  Signed: Gene Tinesha Siegrist 03/08/2011, 3:11 PM Co-Sign MD

## 2011-03-08 NOTE — Progress Notes (Signed)
03/08/11 1619 , pt and husband concern  about increase CBG's called Dr. Deirdre Priest  To talk with patient about this .[facility] will be up to talk with them. [address] [employer]

## 2011-03-08 NOTE — Progress Notes (Signed)
FMTS Attending Note  Patient seen by me, discussed with Dr. Louanne Belton (FM resident today).  Patient is ambulating easily, denies chest pain.  Reviewed CBGs; she continues to run high despite starting Lantus 10 units last night.   For DM control, I agree with raising Lantus dose to 15 units nightly and continuing to monitor.  She tells me she does not have a glucometer at home (had been doing well with dietary control).  Therefore, would order glucometer and DM teaching now, in order to improve her ability to check CBGs at home and administer insulin.  Would start low-dose metformin (500mg  in the morning) until follow up in the coming week at [facility], at which time she can be titrated upward.   Paula Compton, MD

## 2011-03-10 ENCOUNTER — Ambulatory Visit (INDEPENDENT_AMBULATORY_CARE_PROVIDER_SITE_OTHER): Payer: Medicare Other | Admitting: Family Medicine

## 2011-03-10 ENCOUNTER — Encounter: Payer: Self-pay | Admitting: Family Medicine

## 2011-03-10 VITALS — BP 170/80 | HR 102 | Temp 98.7°F | Ht 65.0 in | Wt 204.0 lb

## 2011-03-10 DIAGNOSIS — E119 Type 2 diabetes mellitus without complications: Secondary | ICD-10-CM

## 2011-03-10 DIAGNOSIS — I10 Essential (primary) hypertension: Secondary | ICD-10-CM

## 2011-03-10 DIAGNOSIS — J40 Bronchitis, not specified as acute or chronic: Secondary | ICD-10-CM

## 2011-03-10 MED ORDER — AZITHROMYCIN 250 MG PO TABS: ORAL_TABLET | ORAL | Status: DC

## 2011-03-10 MED ORDER — PAROXETINE HCL 20 MG PO TABS: 20.0000 mg | ORAL_TABLET | ORAL | Status: DC

## 2011-03-10 NOTE — Progress Notes (Signed)
  Subjective:    Patient ID: Casey [name], female    DOB: 06-04-1940, 71 y.o.   MRN: [id number]  HPI  Bronchitis Coughing with yellow sputum and runny nose and sore throat. No fever or shortness of breath or rash. No leg swelling.  Feels like she caught somehting in the hosptial  DIABETES Disease Monitoring: Blood Sugar ranges-300s Polyuria/phagia/dipsia- yes      Visual problems- no  Medications: Compliance- scared to take Lantus only took 5 units today.  Is taking metformin 500 twice daily without symptoms Hypoglycemic symptoms- no  Review of Symptoms - see HPI  PMH - Smoking status noted.      Review of Systems     Objective:   Physical Exam Lungs:  Normal respiratory effort, chest expands symmetrically. Lungs are clear to auscultation, no crackles or wheezes. Heart - Regular rate and rhythm.  No murmurs, gallops or rubs.    Extremities:  No cyanosis, edema, or deformity noted with good range of motion of all major joints.   Throat: normal mucosa, no exudate, uvula midline, slight redness Nose - clear rhinorhea No sinus tenderness       Assessment & Plan:

## 2011-03-10 NOTE — Assessment & Plan Note (Signed)
Not well controlled.  Continue Lantus and gradaully increase metformin.  Monitor fasting blood sugars

## 2011-03-10 NOTE — Assessment & Plan Note (Signed)
Mild.   She is concerned she caught something in the hosptial and it will go to pneumonia which she has had in past so will start antibiotics No signs of CHF or pneumonia

## 2011-03-10 NOTE — Patient Instructions (Signed)
Diabetes Goal is fasting blood sugar < 150 For now take Lantus 15 units once every morning.    If your fasting is < than 170 then stop the Lantus If the fasting is > 250 go up by one unit each morning Take the metformin 1 tab twice daily for 4 days then start 2 tablets twice a day  If you feel lightheadness or clammy or shaky then check your blood sugar it could be low.  If less than 60 then drink sweet drink  Call with your fasting blood sugar readings on Thursday AM  See me in the office in 2 weeks.  Bring all your medicines and your blood sugar readings

## 2011-03-10 NOTE — Assessment & Plan Note (Signed)
Not well controlled.  Might be due to stress of recent hospitalization, bronchitis and not well controlled diabetes.  Will watch and not make medication changes now

## 2011-03-24 ENCOUNTER — Ambulatory Visit (INDEPENDENT_AMBULATORY_CARE_PROVIDER_SITE_OTHER): Payer: Medicare Other | Admitting: Family Medicine

## 2011-03-24 ENCOUNTER — Ambulatory Visit (INDEPENDENT_AMBULATORY_CARE_PROVIDER_SITE_OTHER): Payer: Medicare Other | Admitting: Cardiovascular Disease

## 2011-03-24 ENCOUNTER — Encounter: Payer: Self-pay | Admitting: Family Medicine

## 2011-03-24 ENCOUNTER — Encounter: Payer: Self-pay | Admitting: Cardiovascular Disease

## 2011-03-24 VITALS — BP 116/75 | HR 74 | Ht 64.0 in | Wt 209.0 lb

## 2011-03-24 VITALS — BP 126/70 | HR 84 | Ht 64.0 in | Wt 208.0 lb

## 2011-03-24 DIAGNOSIS — E119 Type 2 diabetes mellitus without complications: Secondary | ICD-10-CM

## 2011-03-24 DIAGNOSIS — I2510 Atherosclerotic heart disease of native coronary artery without angina pectoris: Secondary | ICD-10-CM | POA: Insufficient documentation

## 2011-03-24 MED ORDER — ATORVASTATIN CALCIUM 10 MG PO TABS: 10.0000 mg | ORAL_TABLET | Freq: Every day | ORAL | Status: DC

## 2011-03-24 MED ORDER — EZETIMIBE 10 MG PO TABS: 10.0000 mg | ORAL_TABLET | Freq: Every day | ORAL | Status: DC

## 2011-03-24 MED ORDER — INSULIN GLARGINE 100 UNIT/ML ~~LOC~~ SOLN: 17.0000 [IU] | Freq: Every day | SUBCUTANEOUS | Status: DC

## 2011-03-24 MED ORDER — METOPROLOL SUCCINATE ER 25 MG PO TB24: 25.0000 mg | ORAL_TABLET | Freq: Every day | ORAL | Status: DC

## 2011-03-24 MED ORDER — AMLODIPINE BESYLATE 5 MG PO TABS: 5.0000 mg | ORAL_TABLET | Freq: Every day | ORAL | Status: DC

## 2011-03-24 MED ORDER — METFORMIN HCL 500 MG PO TABS: 1000.0000 mg | ORAL_TABLET | Freq: Two times a day (BID) | ORAL | Status: DC

## 2011-03-24 NOTE — Progress Notes (Signed)
History of Present Illness: 71 yo female with history of CAD s/p 2 V CABG in 1998, HLD, DM, HTN here today for cardiac follow up. She has been followed in the past by Dr. Deborah Chalk. Her cardiac issues date back to 1992 at which time she had an MI and PTCA LAD. She then had 2V CABG in 1998. I saw her for the first time in August 2012. Since then, she was admitted to [facility]/06/11 with chest pain and enzymes were negative.  Cardiac cath with patent SVG to Circumflex, patent stents RCA, patent LM stent, non-obstructive disease LAD. (cath per Dr. Swaziland). Medical therapy recommended. Her LV function was normal.   She tells me that she is feeling better. She has had no further chest pains. She has not been walking. No SOB, palpitations.      Past Medical History  Diagnosis Date  . Empty sella syndrome     In 70's  . Lupus     Inactive since 1970  . Hyperlipidemia   . Hypertension   . Coronary artery disease     MI with PTCA to LAD 1992. 2V CABG 1998. PTCA/stent to prox and distal RCA 2004.  DES to high-grade LM with balloon angioplasty of ramus intermedius 12/2009 (EF55% then)  . Submandibular abscess 2008  . Angina   . Shortness of breath   . Arthritis   . Myocardial infarction   . Diabetes mellitus     Past Surgical History  Procedure Date  . Coronary artery bypass graft 10/1996    Current Outpatient Prescriptions  Medication Sig Dispense Refill  . amLODipine (NORVASC) 5 MG tablet TAKE ONE TABLET BY MOUTH EVERY DAY  30 tablet  4  . aspirin 325 MG tablet Take 325 mg by mouth daily.      Marland Kitchen atorvastatin (LIPITOR) 10 MG tablet TAKE ONE TABLET BY MOUTH IN THE EVENING  30 tablet  4  . Blood Glucose Monitoring Suppl (BLOOD GLUCOSE METER) kit Use as instructed  1 each  0  . clopidogrel (PLAVIX) 75 MG tablet TAKE ONE TABLET BY MOUTH EVERY DAY  90 tablet  2  . fish oil-omega-3 fatty acids 1000 MG capsule Take 1 g by mouth daily.       . Glucosamine 500 MG CAPS Take 1 capsule  by mouth daily.       Marland Kitchen glucose blood (ACCU-CHEK INSTANT GLUCOSE TEST) test strip Check blood sugar 4 times daily (every morning and before meals), Diagnosis Code 250.02  100 each  6  . insulin glargine (LANTUS) 100 UNIT/ML injection Inject 15 Units into the skin at bedtime.      . Lancets (BD LANCET ULTRAFINE 30G) MISC Check blood sugar 4 times daily (with meals and upon waking up every morning) Diagnosis code 250.02  100 each  6  . metFORMIN (GLUCOPHAGE) 500 MG tablet Take 1 tablet (500 mg total) by mouth 2 (two) times daily with a meal.  60 tablet  0  . metoprolol succinate (TOPROL-XL) 25 MG 24 hr tablet TAKE ONE TABLET BY MOUTH EVERY DAY  30 tablet  4  . Multiple Vitamin (MULTIVITAMIN) capsule Take 1 capsule by mouth daily.       . nitroGLYCERIN (NITROSTAT) 0.4 MG SL tablet Place 1 tablet (0.4 mg total) under the tongue every 5 (five) minutes as needed for chest pain.  25 tablet  1  . PARoxetine (PAXIL) 20 MG tablet Take 1 tablet (20 mg total) by mouth every morning.  90 tablet  3  . ZETIA 10 MG tablet TAKE ONE TABLET BY MOUTH EVERY DAY  90 each  2  . azithromycin (ZITHROMAX Z-PAK) 250 MG tablet 2 now then one tab daily until gone.  Dispense Generic  6 each  0    Allergies  Allergen Reactions  . Iohexol      Desc: life threatning 06/30/2005     History   Social History  . Marital Status: Divorced    Spouse Name: N/A    Number of Children: N/A  . Years of Education: N/A   Occupational History  . Retired    Social History Main Topics  . Smoking status: Never Smoker   . Smokeless tobacco: Never Used  . Alcohol Use: Yes     occasional   . Drug Use: No  . Sexually Active: Not Currently    Birth Control/ Protection: Post-menopausal   Other Topics Concern  . Not on file   Social History Narrative   Has a fiance with whom she shares a Orthoptist clothes.  Lives part time in [place] and in [place].   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 2 grandsons  Thayer Ohm and Maisie Fus - toubled 08/15/09 - Grandsons now 28 and 26. Her daughter (their mother) is dying.Has boyfriend. No DV.Sister Francella Solian.    Family History  Problem Relation Age of Onset  . Coronary artery disease Father     Sister also had ischemic heart disease  . Hypertension Mother     Review of Systems:  As stated in the HPI and otherwise negative.   BP 126/70  Pulse 84  Ht 5\' 4"  (1.626 m)  Wt 208 lb (94.348 kg)  BMI 35.70 kg/m2  Physical Examination: General: Well developed, well nourished, NAD HEENT: OP clear, mucus membranes moist SKIN: warm, dry. No rashes. Neuro: No focal deficits Musculoskeletal: Muscle strength 5/5 all ext Psychiatric: Mood and affect normal Neck: No JVD, no carotid bruits, no thyromegaly, no lymphadenopathy. Lungs:Clear bilaterally, no wheezes, rhonci, crackles Cardiovascular: Regular rate and rhythm. No murmurs, gallops or rubs. Abdomen:Soft. Bowel sounds present. Non-tender.  Extremities: No lower extremity edema. Pulses are 2 + in the bilateral DP/PT.  Cardiac cath: 03/07/11:  Left mainstem: This is widely patent at the prior stent site extending into the LAD. No significant obstruction is noted.  Left anterior descending (LAD): Widely patent in the stented segment proximally. There is a 40-50% stenosis in the mid vessel.  Left circumflex (LCx): This is a small-caliber vessel that basically consist of an AV groove branch. The marginal branch is occluded.  Right coronary artery (RCA): This is a large dominant vessel. There are extensive stents throughout the vessel. There is 30% segmental disease in the mid vessel. Otherwise nonobstructive disease.  The saphenous vein graft to the obtuse marginal vessel is widely patent. There is 30-40% disease in the distal vein graft. There is excellent runoff.  The LIMA graft to the LAD is atretic distally.  Left ventriculography: Left ventricular systolic function is normal, LVEF is estimated at 55-65%,  there is no significant mitral regurgitation  Final Conclusions:  1. Single vessel obstructive coronary disease involving the native left circumflex coronary. There is still excellent patency of the extensive stents in the right coronary as well as the stent in the left main coronary extending into the LAD.  2. Patent saphenous vein graft to the first obtuse marginal vessel which essentially supplies the entire circumflex.  3. The atretic LIMA  graft distally.  4. Normal left ventricular function.

## 2011-03-24 NOTE — Patient Instructions (Signed)
Your physician wants you to follow-up in: 6 months  You will receive a reminder letter in the mail two months in advance. If you don't receive a letter, please call our office to schedule the follow-up appointment.  Your physician recommends that you continue on your current medications as directed. Please refer to the Current Medication list given to you today.  

## 2011-03-24 NOTE — Assessment & Plan Note (Signed)
Not at goal.  Continue to increase Lantus and metformin.  Aim is to have blood sugar < 180

## 2011-03-24 NOTE — Assessment & Plan Note (Signed)
Stable s/p recent cath. No chest pain. Continue current therapy. Lipids and BP well controlled. Last lipids 03/07/11.

## 2011-03-24 NOTE — Patient Instructions (Addendum)
See your eye doctor  Keep taking metformin 1000 mg twice daily ( I sent in a new Rx for 1000 mg tabs)  Increase the Lantus to 17 u every AM.     Call in your blood sugar readings next Friday AM  Call if your blood sugar is < 60 or > 350 any morning  Rosalita Chessman will contact you for a AWV wellness visit and to set up behavioral changes   Bring in the name of the doctor who did your colonoscopy

## 2011-03-24 NOTE — Progress Notes (Signed)
  Subjective:    Patient ID: Casey [name], female    DOB: April 13, 1940, 71 y.o.   MRN: [id number]  HPI DIABETES Disease Monitoring: Blood Sugar ranges-220-270  Polyuria/phagia/dipsia- no      Visual problems- no  Medications: Compliance- 15u lantus daily  Hypoglycemic symptoms- no    Review of Systems     Objective:   Physical Exam no apparent distress       Assessment & Plan:

## 2011-03-24 NOTE — Progress Notes (Signed)
Addended by: Dossie Arbour on: 03/24/2011 12:25 PM   Modules accepted: Orders

## 2011-04-02 ENCOUNTER — Ambulatory Visit: Payer: Medicare Other | Admitting: Home Health Services

## 2011-04-28 ENCOUNTER — Encounter: Payer: Self-pay | Admitting: Family Medicine

## 2011-04-28 ENCOUNTER — Ambulatory Visit (INDEPENDENT_AMBULATORY_CARE_PROVIDER_SITE_OTHER): Payer: Medicare Other | Admitting: Family Medicine

## 2011-04-28 VITALS — BP 128/78 | HR 72 | Ht 64.0 in | Wt 214.9 lb

## 2011-04-28 DIAGNOSIS — E119 Type 2 diabetes mellitus without complications: Secondary | ICD-10-CM

## 2011-04-28 DIAGNOSIS — Z23 Encounter for immunization: Secondary | ICD-10-CM

## 2011-04-28 MED ORDER — INSULIN GLARGINE 100 UNIT/ML ~~LOC~~ SOLN: 17.0000 [IU] | Freq: Every morning | SUBCUTANEOUS | Status: DC

## 2011-04-28 NOTE — Progress Notes (Signed)
  Subjective:    Patient ID: AURORAH SCHLACHTER, female    DOB: 04-Nov-1940, 71 y.o.   MRN: [id number]  HPI  DIABETES Disease Monitoring: Blood Sugar ranges-from 120-180s in the last 2 weeks Polyuria/phagia/dipsia- no      Visual problems- no  Medications: Compliance- taking 17 u of lantus regularly.  Has missed metformin doses at least 5 times in the last week Hypoglycemic symptoms- no  SH  Will be moving to [facility] for 6-9 months in 2 weeks  Review of Systems     Objective:   Physical Exam no apparent distress        Assessment & Plan:

## 2011-04-28 NOTE — Assessment & Plan Note (Signed)
Improving but not at goal.  Hopefully she will be more regular with metformin.

## 2011-04-28 NOTE — Patient Instructions (Addendum)
You need your A1c checked in April 5  I would look for a family physician or internist  Continue taking Lantus 17 u each AM.  If your blood sugar regularly is less than 90-100 then call me.     If after you have been taking metformin every day twice daily for 2 weeks and your blood sugars are > 170 then go up to 18 u of Lantus every day  I would consider buying a blood pressure machine (automatic one) and checking your blood pressure 2-3 times a week  Consider the Shingles Vaccine - contact your insurance  Come back in  9 months

## 2011-05-19 ENCOUNTER — Encounter: Payer: Self-pay | Admitting: Home Health Services

## 2011-08-13 ENCOUNTER — Telehealth: Payer: Self-pay | Admitting: *Deleted

## 2011-08-13 NOTE — Telephone Encounter (Signed)
Pharmacy calling to clarify how many times patient checks CBG daily.  Rx received for Accuchek Aviva Plus test strips.  Rx on file states to check TID, but CMN form last approved by doctor say for once a day testing.  Altamese Dilling, BSN, RN-BC

## 2011-10-14 ENCOUNTER — Telehealth: Payer: Self-pay | Admitting: Family Medicine

## 2011-10-14 NOTE — Telephone Encounter (Signed)
She would need to ask her cardiologist if he thinks it is safe for her to be off of Plavix since he prescrbed it  Please let her know She needs to see me this month for a diabetes visit  Thanks  LC

## 2011-10-14 NOTE — Telephone Encounter (Signed)
Pt is having colonoscopy and needs permission to be off of Plavix 5 days before procedure. Dr. Ewing Schlein is who is doing this.

## 2011-10-14 NOTE — Telephone Encounter (Signed)
LMOVM for pt to return call.  Please inform of below when she calls. Karolyna Bianchini, Maryjo Rochester

## 2011-10-27 ENCOUNTER — Other Ambulatory Visit: Payer: Self-pay | Admitting: *Deleted

## 2011-10-27 MED ORDER — CLOPIDOGREL BISULFATE 75 MG PO TABS: 75.0000 mg | ORAL_TABLET | Freq: Every day | ORAL | Status: DC

## 2011-10-28 ENCOUNTER — Telehealth: Payer: Self-pay | Admitting: *Deleted

## 2011-10-28 NOTE — Telephone Encounter (Signed)
Received fax in office from Dr. Ewing Schlein requesting pt be off clopidogrel for colonoscopy. Pt last saw Dr. Clifton James in January 2013 with 6 month follow up planned. I called pt to schedule this appt. Left message on home and cell numbers to call back

## 2011-10-28 NOTE — Telephone Encounter (Signed)
Form from Dr. Marlane Hatcher office completed by Dr. Clifton James indicating it was OK to hold Plavix 7 days before procedure and restart when safe after procedure.  Will fax completed form to Dr. Marlane Hatcher office today.

## 2011-11-04 ENCOUNTER — Other Ambulatory Visit: Payer: Self-pay | Admitting: Family Medicine

## 2011-11-04 DIAGNOSIS — Z1231 Encounter for screening mammogram for malignant neoplasm of breast: Secondary | ICD-10-CM

## 2011-11-05 ENCOUNTER — Ambulatory Visit
Admission: RE | Admit: 2011-11-05 | Discharge: 2011-11-05 | Disposition: A | Discharge status: Home / Self Care | Payer: Medicare Other | Source: Ambulatory Visit | Attending: Family Medicine | Admitting: Family Medicine

## 2011-11-05 ENCOUNTER — Ambulatory Visit (INDEPENDENT_AMBULATORY_CARE_PROVIDER_SITE_OTHER): Payer: Medicare Other | Admitting: Family Medicine

## 2011-11-05 DIAGNOSIS — I10 Essential (primary) hypertension: Secondary | ICD-10-CM

## 2011-11-05 DIAGNOSIS — Z1231 Encounter for screening mammogram for malignant neoplasm of breast: Secondary | ICD-10-CM

## 2011-11-05 DIAGNOSIS — I2510 Atherosclerotic heart disease of native coronary artery without angina pectoris: Secondary | ICD-10-CM

## 2011-11-05 DIAGNOSIS — E782 Mixed hyperlipidemia: Secondary | ICD-10-CM

## 2011-11-05 DIAGNOSIS — Z23 Encounter for immunization: Secondary | ICD-10-CM

## 2011-11-05 DIAGNOSIS — E119 Type 2 diabetes mellitus without complications: Secondary | ICD-10-CM

## 2011-11-05 MED ORDER — METOPROLOL SUCCINATE ER 25 MG PO TB24: 25.0000 mg | ORAL_TABLET | Freq: Every day | ORAL | Status: DC

## 2011-11-05 MED ORDER — ATORVASTATIN CALCIUM 10 MG PO TABS: 10.0000 mg | ORAL_TABLET | Freq: Every day | ORAL | Status: DC

## 2011-11-05 MED ORDER — INSULIN GLARGINE 100 UNIT/ML ~~LOC~~ SOLN: 17.0000 [IU] | Freq: Every morning | SUBCUTANEOUS | Status: AC

## 2011-11-05 MED ORDER — CLOPIDOGREL BISULFATE 75 MG PO TABS: 75.0000 mg | ORAL_TABLET | Freq: Every day | ORAL | Status: DC

## 2011-11-05 MED ORDER — PAROXETINE HCL 20 MG PO TABS: 20.0000 mg | ORAL_TABLET | ORAL | Status: DC

## 2011-11-05 MED ORDER — METFORMIN HCL 1000 MG PO TABS: 1000.0000 mg | ORAL_TABLET | Freq: Two times a day (BID) | ORAL | Status: DC

## 2011-11-05 MED ORDER — EZETIMIBE 10 MG PO TABS: 10.0000 mg | ORAL_TABLET | Freq: Every day | ORAL | Status: DC

## 2011-11-05 MED ORDER — AMLODIPINE BESYLATE 5 MG PO TABS: 5.0000 mg | ORAL_TABLET | Freq: Every day | ORAL | Status: DC

## 2011-11-05 NOTE — Assessment & Plan Note (Signed)
Well controlled.  Recent labs are reassuring.  Continue current medications

## 2011-11-05 NOTE — Assessment & Plan Note (Signed)
Well controlled without side effects

## 2011-11-05 NOTE — Assessment & Plan Note (Signed)
Much improved control.  Hopefully will continue if can remember to take metformin twice daily

## 2011-11-05 NOTE — Patient Instructions (Addendum)
Get your mammogram  Call about Zostavax Shingles vaccine  See your eye doctor ask them to send me a report  Take the metformin twice daily - any time after 6 pm that you can remember - do not have to take with food  Your main homework it to work on diet and exercise  We should check your A1c every 3-6 months - Early winter 2014

## 2011-11-05 NOTE — Telephone Encounter (Signed)
Left message on pt's home and cell numbers to call back

## 2011-11-05 NOTE — Progress Notes (Signed)
  Subjective:    Patient ID: Casey [name], female    DOB: 1940/10/06, 71 y.o.   MRN: [id number]  HPI HYPERTENSION Disease Monitoring: Blood pressure range-Not checking Chest pain, palpitations- no      Dyspnea- no Medications: Compliance- all medications daily Lightheadedness,Syncope- no   Edema- no   DIABETES Disease Monitoring: Blood Sugar ranges-not checking regularly Polyuria/phagia/dipsia- no      Visual problems- no Medications: Compliance- sometiemes misses PM metformin Hypoglycemic symptoms- no    HYPERLIPIDEMIA Disease Monitoring: See symptoms for Hypertension Medications: Compliance- daily lipitor Right upper quadrant pain- no  Muscle aches- no   ROS See HPI above   PMH Smoking Status noted       Review of Systems     Objective:   Physical Exam  Heart - Regular rate and rhythm.  No murmurs, gallops or rubs.    Lungs:  Normal respiratory effort, chest expands symmetrically. Lungs are clear to auscultation, no crackles or wheezes. Extremities:  No cyanosis, edema, or deformity noted with good range of motion of all major joints.         Assessment & Plan:

## 2011-11-07 ENCOUNTER — Encounter (HOSPITAL_COMMUNITY): Payer: Self-pay | Admitting: Gastroenterology

## 2011-11-07 ENCOUNTER — Encounter (HOSPITAL_COMMUNITY)
Admission: RE | Disposition: A | Discharge status: Home / Self Care | Payer: Self-pay | Source: Ambulatory Visit | Attending: Gastroenterology

## 2011-11-07 ENCOUNTER — Encounter (HOSPITAL_COMMUNITY): Payer: Self-pay | Admitting: Anesthesiology

## 2011-11-07 ENCOUNTER — Encounter (HOSPITAL_COMMUNITY): Payer: Self-pay

## 2011-11-07 ENCOUNTER — Ambulatory Visit (HOSPITAL_COMMUNITY)
Admission: RE | Admit: 2011-11-07 | Discharge: 2011-11-07 | Disposition: A | Discharge status: Home / Self Care | Payer: Medicare Other | Source: Ambulatory Visit | Attending: Gastroenterology | Admitting: Gastroenterology

## 2011-11-07 ENCOUNTER — Ambulatory Visit (HOSPITAL_COMMUNITY): Payer: Medicare Other | Admitting: Anesthesiology

## 2011-11-07 DIAGNOSIS — K644 Residual hemorrhoidal skin tags: Secondary | ICD-10-CM | POA: Insufficient documentation

## 2011-11-07 DIAGNOSIS — K648 Other hemorrhoids: Secondary | ICD-10-CM | POA: Insufficient documentation

## 2011-11-07 DIAGNOSIS — D126 Benign neoplasm of colon, unspecified: Secondary | ICD-10-CM | POA: Insufficient documentation

## 2011-11-07 DIAGNOSIS — Z09 Encounter for follow-up examination after completed treatment for conditions other than malignant neoplasm: Secondary | ICD-10-CM | POA: Insufficient documentation

## 2011-11-07 HISTORY — PX: COLONOSCOPY: SHX5424

## 2011-11-07 SURGERY — COLONOSCOPY
Anesthesia: Monitor Anesthesia Care

## 2011-11-07 MED ORDER — SODIUM CHLORIDE 0.9 % IV SOLN: INTRAVENOUS | Status: DC

## 2011-11-07 MED ORDER — PROPOFOL 10 MG/ML IV EMUL
INTRAVENOUS | Status: DC | PRN
  Administered 2011-11-07: 120 ug/kg/min via INTRAVENOUS

## 2011-11-07 MED ORDER — FENTANYL CITRATE 0.05 MG/ML IJ SOLN
INTRAMUSCULAR | Status: DC | PRN
  Administered 2011-11-07 (×2): 50 ug via INTRAVENOUS

## 2011-11-07 MED ORDER — LACTATED RINGERS IV SOLN
INTRAVENOUS | Status: DC | PRN
  Administered 2011-11-07: 07:00:00 via INTRAVENOUS

## 2011-11-07 MED ORDER — LIDOCAINE HCL (CARDIAC) 20 MG/ML IV SOLN
INTRAVENOUS | Status: DC | PRN
  Administered 2011-11-07: 30 mg via INTRAVENOUS

## 2011-11-07 MED ORDER — MIDAZOLAM HCL 5 MG/5ML IJ SOLN
INTRAMUSCULAR | Status: DC | PRN
  Administered 2011-11-07: 2 mg via INTRAVENOUS

## 2011-11-07 MED ORDER — LACTATED RINGERS IV SOLN: INTRAVENOUS | Status: DC

## 2011-11-07 NOTE — Progress Notes (Signed)
Casey [name] 7:44 AM  Subjective: Patient has had no new medical problems since I last saw her and has no GI complaints and her medical history was reviewed  Objective: Vital signs stable afebrile no acute distress exam please see pre-assessment evaluation  Assessment: Personal history of colon polyps in a patient with multiple medical problems due for repeat colon screening  Plan: Okay to proceed with colonoscopy this a.m.  Casey [name] E

## 2011-11-07 NOTE — Transfer of Care (Signed)
Immediate Anesthesia Transfer of Care Note  Patient: Casey [name]  Procedure(s) Performed: Procedure(s) (LRB) with comments: COLONOSCOPY (N/A)  Patient Location: PACU  Anesthesia Type: MAC  Level of Consciousness: awake, alert  and oriented  Airway & Oxygen Therapy: Patient Spontanous Breathing and Patient connected to face mask oxygen  Post-op Assessment: Report given to PACU RN and Post -op Vital signs reviewed and stable  Post vital signs: Reviewed and stable  Complications: No apparent anesthesia complications

## 2011-11-07 NOTE — Anesthesia Preprocedure Evaluation (Signed)
Anesthesia Evaluation  Patient identified by MRN, date of birth, ID band Patient awake    Reviewed: Allergy & Precautions, H&P , NPO status , Patient's Chart, lab work & pertinent test results  Airway Mallampati: II TM Distance: >3 FB Neck ROM: Full    Dental No notable dental hx.    Pulmonary neg pulmonary ROS,  breath sounds clear to auscultation  Pulmonary exam normal       Cardiovascular hypertension, + CAD, + Past MI and + CABG Rhythm:Regular Rate:Normal     Neuro/Psych negative neurological ROS  negative psych ROS   GI/Hepatic negative GI ROS, Neg liver ROS,   Endo/Other  diabetes, Insulin DependentSLE  Renal/GU negative Renal ROS  negative genitourinary   Musculoskeletal negative musculoskeletal ROS (+)   Abdominal   Peds negative pediatric ROS (+)  Hematology negative hematology ROS (+)   Anesthesia Other Findings   Reproductive/Obstetrics negative OB ROS                           Anesthesia Physical Anesthesia Plan  ASA: III  Anesthesia Plan: MAC   Post-op Pain Management:    Induction: Intravenous  Airway Management Planned: Nasal Cannula  Additional Equipment:   Intra-op Plan:   Post-operative Plan:   Informed Consent: I have reviewed the patients History and Physical, chart, labs and discussed the procedure including the risks, benefits and alternatives for the proposed anesthesia with the patient or authorized representative who has indicated his/her understanding and acceptance.     Plan Discussed with: CRNA and Surgeon  Anesthesia Plan Comments:         Anesthesia Quick Evaluation

## 2011-11-07 NOTE — Op Note (Signed)
[facility] [address] [place], [address]   COLONOSCOPY PROCEDURE REPORT  PATIENT: Casey [name], Casey [name].  MR#: [id number] BIRTHDATE: 05-05-1940 , 71  yrs. old GENDER: Female ENDOSCOPIST: Vida Rigger, MD REFERRED BY: PROCEDURE DATE:  11/07/2011 PROCEDURE:   Colonoscopy with snare polypectomy ASA CLASS:   Class II INDICATIONS:history of colon polyps. MEDICATIONS: propofol (Diprivan) 350mg  IV, Fentanyl 100 mcg IV, and Versed 2 mg IV  DESCRIPTION OF PROCEDURE:   After the risks benefits and alternatives of the procedure were thoroughly explained, informed consent was obtained.  The the Pentax video pediatric colonoscope        was introduced through the anus and advanced to the cecum, which was identified by both the appendix and ileocecal valve , limited by No adverse events experienced.   The quality of the prep washigh quit    .  The instrument was then slowly withdrawn as the colon was fully examined.the results are documented below and to advance to the cecum did not require any abdominal pressure or any position changes and once back in the rectum anal rectal pull-through and retroflexion confirms some small hemorrhoids and the scope was reinserted a short ways up the left side of the colon air was suctioned scope removed patient tolerated the procedure well there was no obvious immediate complication     FINDINGS:  1. Internal/external hemorrhoid 2. Small proximal ascending polyp status post hot snare 3. Tiny proximal ascending colon polyp cold snare4. Otherwise within normal limits to the cecum  COMPLICATIONS: none  IMPRESSION:  above  RECOMMENDATIONS: await pathology resume Plavix GI followup when necessary probable repeat colonoscopy in 5 years if doing well medically   _______________________________ eSigned:  Vida Rigger, MD 11/07/2011 8:38 AM   CC:

## 2011-11-07 NOTE — Anesthesia Postprocedure Evaluation (Signed)
  Anesthesia Post-op Note  Patient: Casey [name]  Procedure(s) Performed: Procedure(s) (LRB): COLONOSCOPY (N/A)  Patient Location: PACU  Anesthesia Type: MAC  Level of Consciousness: awake and alert   Airway and Oxygen Therapy: Patient Spontanous Breathing  Post-op Pain: mild  Post-op Assessment: Post-op Vital signs reviewed, Patient's Cardiovascular Status Stable, Respiratory Function Stable, Patent Airway and No signs of Nausea or vomiting  Post-op Vital Signs: stable  Complications: No apparent anesthesia complications

## 2011-11-10 ENCOUNTER — Encounter (HOSPITAL_COMMUNITY): Payer: Self-pay | Admitting: Gastroenterology

## 2011-11-13 ENCOUNTER — Encounter: Payer: Self-pay | Admitting: Cardiology

## 2011-11-19 NOTE — Telephone Encounter (Signed)
Pt had colonoscopy done Sept 6, 2013. Will send letter asking pt to schedule follow up with Dr. Clifton James.

## 2011-12-23 ENCOUNTER — Telehealth: Payer: Self-pay | Admitting: *Deleted

## 2011-12-23 MED ORDER — GLUCOSE BLOOD VI STRP: ORAL_STRIP | Status: DC

## 2011-12-23 NOTE — Telephone Encounter (Signed)
I sent an eRx to the pharmacy

## 2011-12-23 NOTE — Telephone Encounter (Signed)
Please let her know that since her a1c was much improved she does not qualify to check her blood sugar twice daily only qhs   Thanks  Lc

## 2011-12-23 NOTE — Telephone Encounter (Signed)
Did you write Rx for test strips to test once a day??  Casey [name]

## 2011-12-23 NOTE — Telephone Encounter (Signed)
Ryan at [facility] requesting new prescription for [employer] test strips.  Patient states she checks her blood sugar twice a day.  Needs Printed prescription with specific directions and diagnosis code in order to get Medicare to pay for.  Fax prescription to [phone].  Ileana Ladd

## 2011-12-24 NOTE — Telephone Encounter (Signed)
Ms. Atz informed to only check her blood sugars once qhs per Dr. Deirdre Priest.  Patient in agreement.  Ileana Ladd

## 2012-04-04 ENCOUNTER — Other Ambulatory Visit: Payer: Self-pay | Admitting: Family Medicine

## 2012-04-09 ENCOUNTER — Other Ambulatory Visit: Payer: Self-pay | Admitting: Family Medicine

## 2012-04-12 IMAGING — CR DG CHEST 2V
2 series · 2 of 2 positions shown · non-contrast
Comparison: 11/09/2006.

CLINICAL DATA: Cough.  Pre catheterization study.  Diabetic
patient.

CHEST - 2 VIEW

[w chest pa]
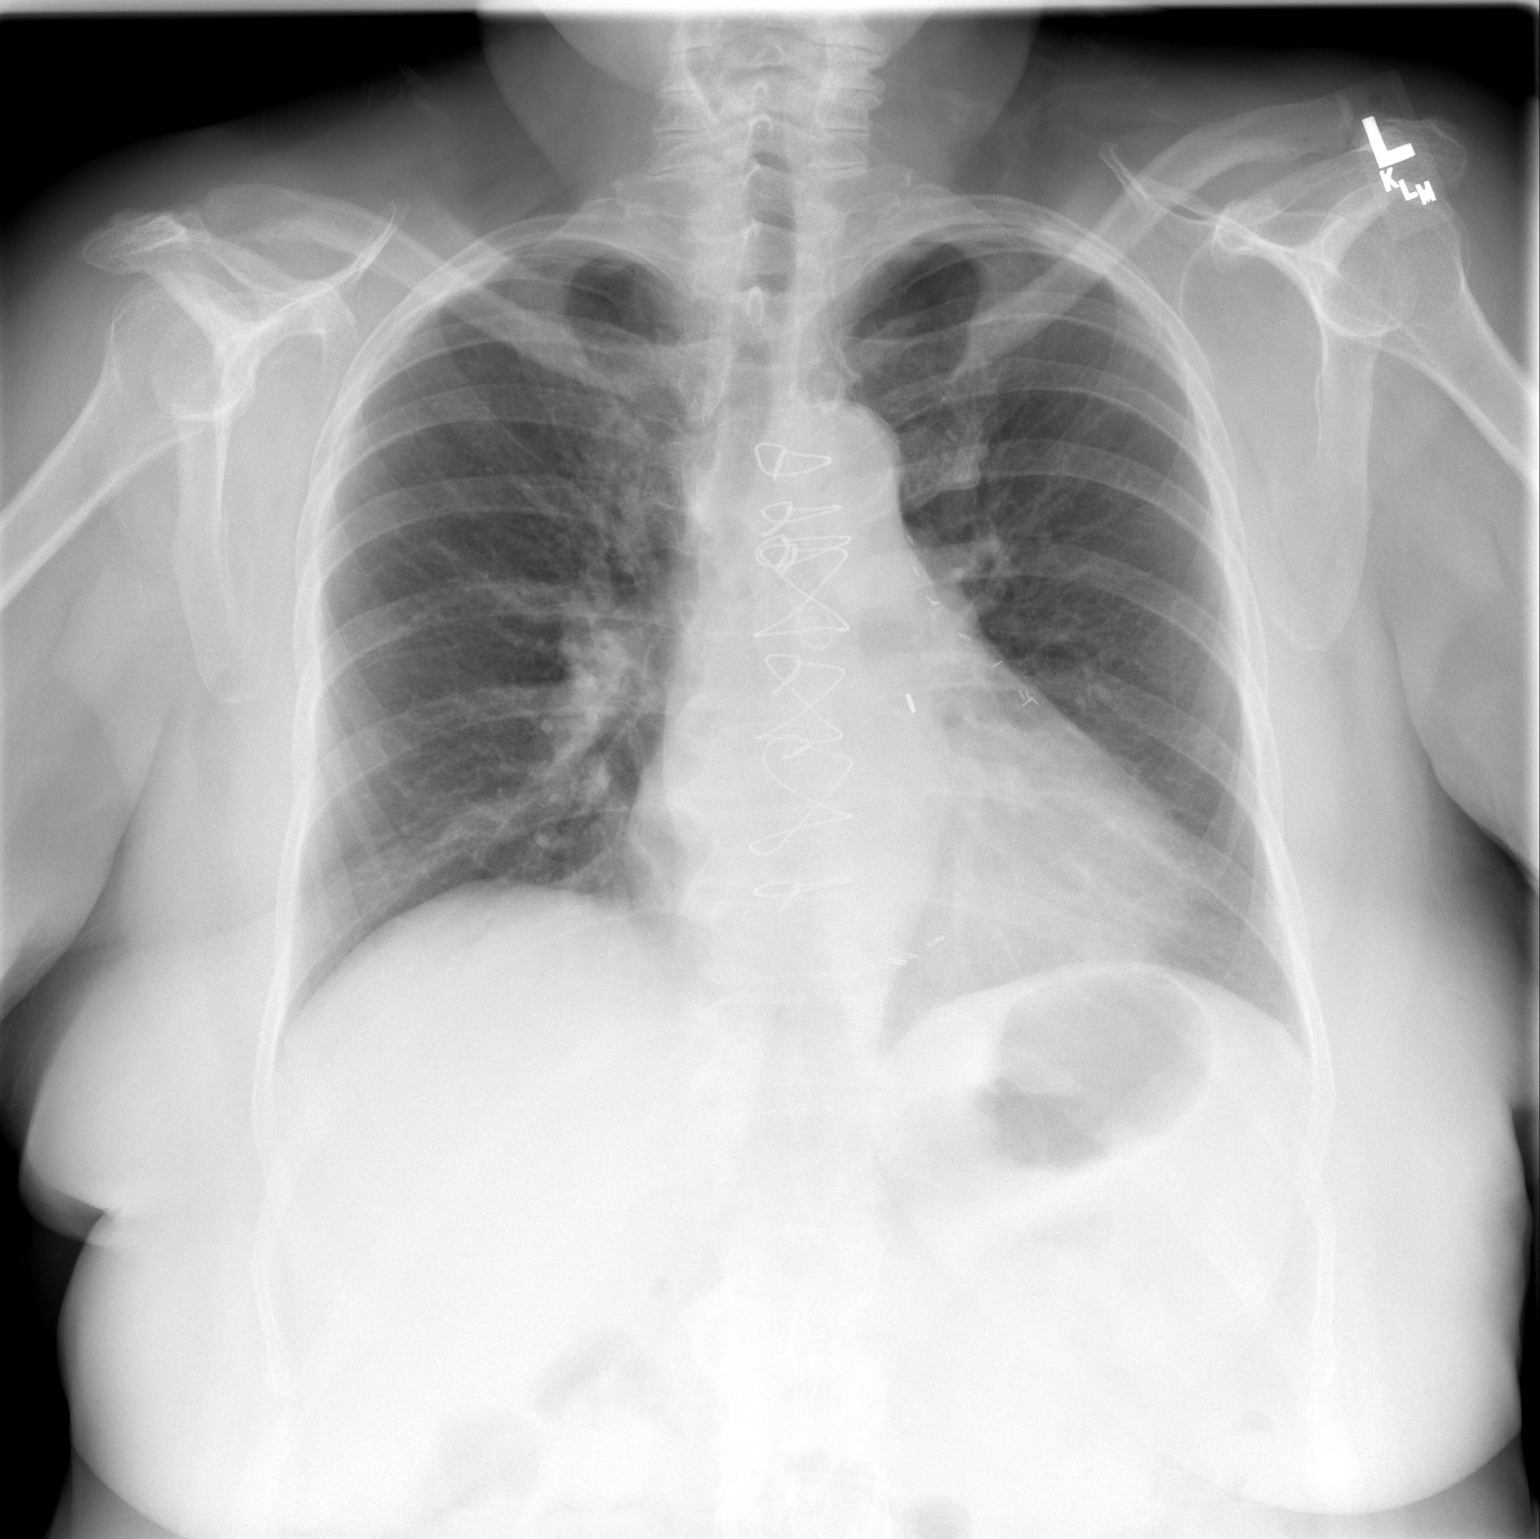

[w chest lat]
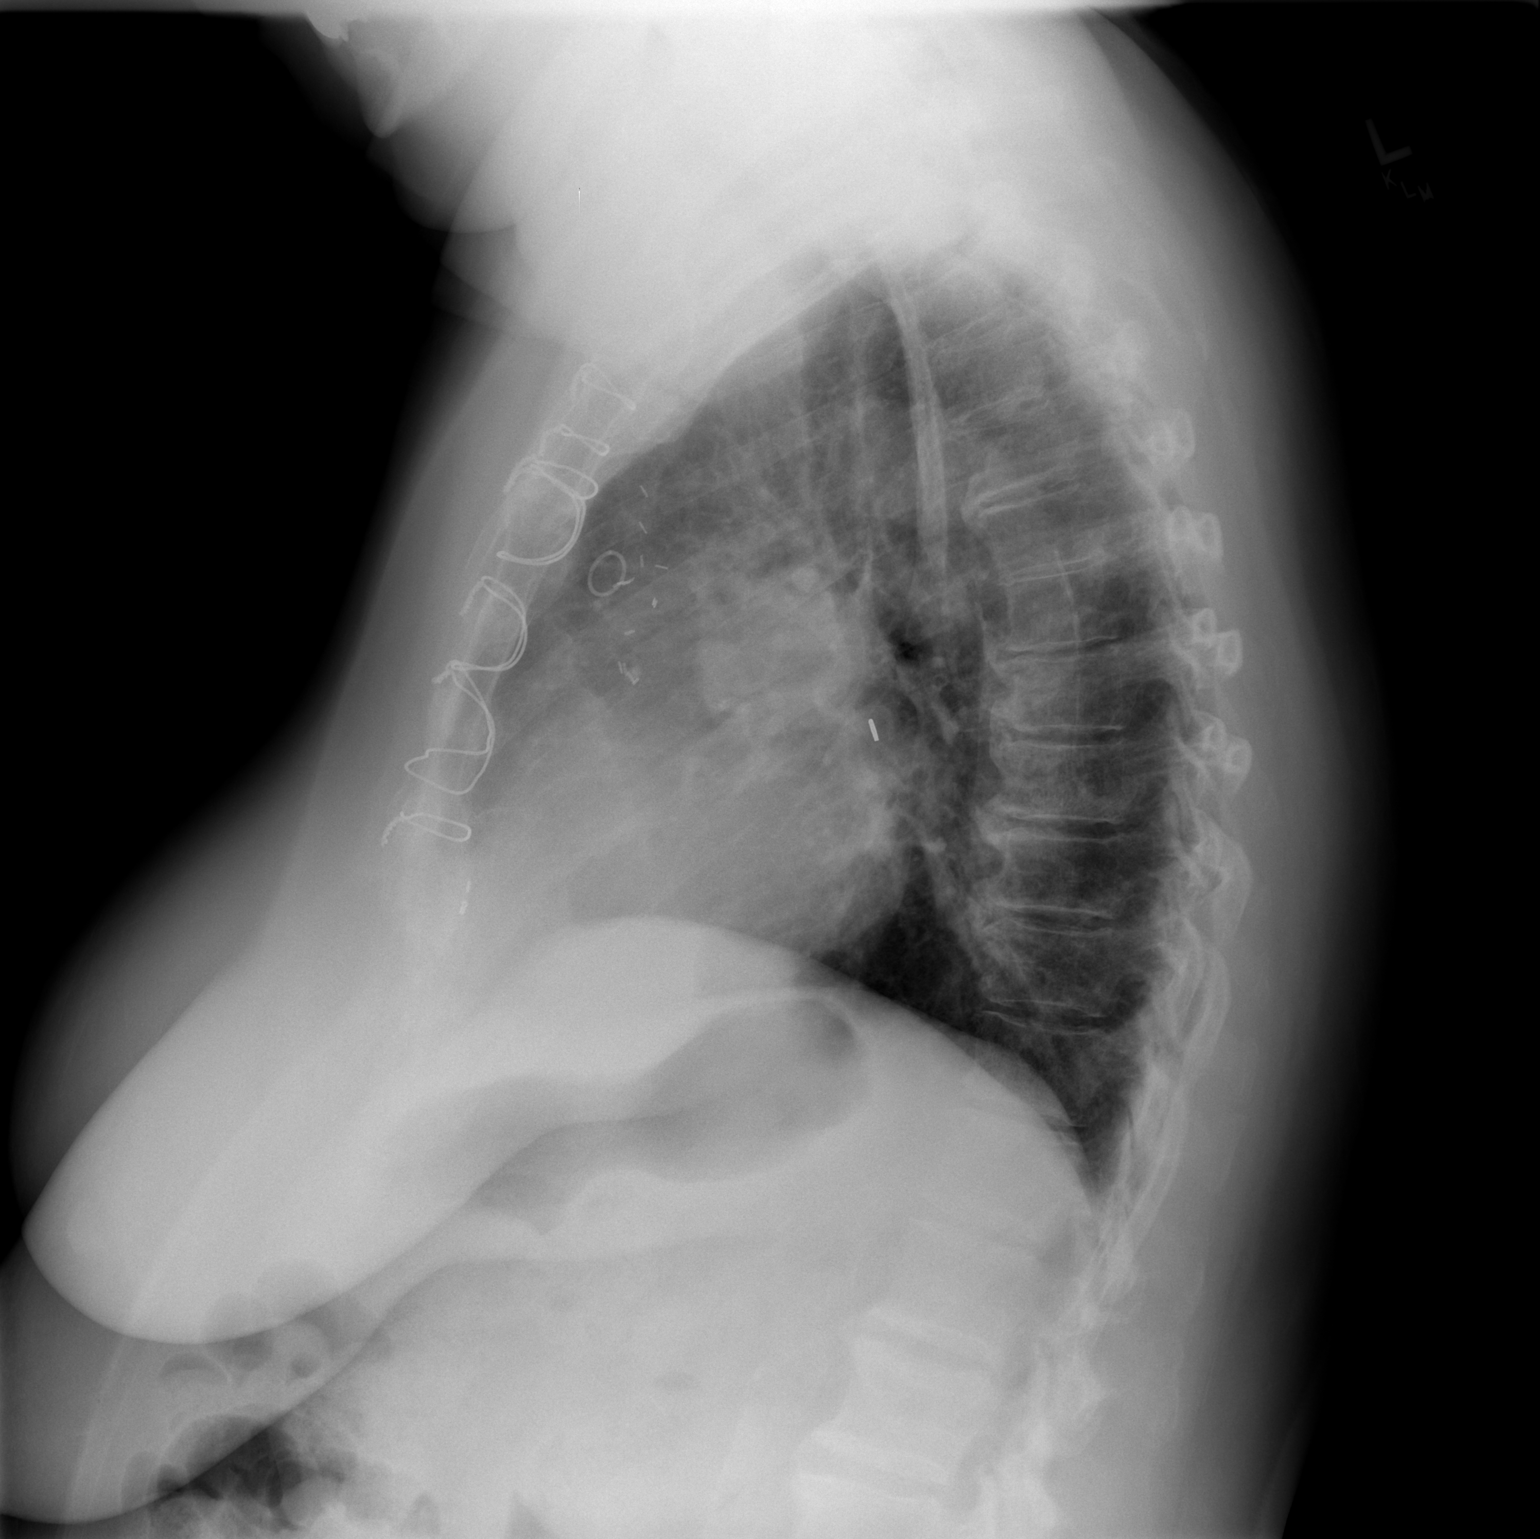

[2 of 2 positions shown; findings below may reference images not displayed]

FINDINGS: There has been a previous median sternotomy.  There is
mild cardiomegaly.  There are no infiltrates or edematous changes.
The osseous structures are unremarkable.
IMPRESSION: 1.  Mild stable cardiomegaly.
2.  No acute findings.

## 2012-04-16 IMAGING — CR DG CHEST 1V PORT
1 series · 1 of 1 positions shown · non-contrast
Comparison: Two-view chest x-ray 12/17/2009 and 06/30/2005.

CLINICAL DATA: Chest pain.  Prior CABG.  Cardiac catheterization
earlier this week.

PORTABLE CHEST - 1 VIEW [DATE]/[id number] hours:

[view not recorded]
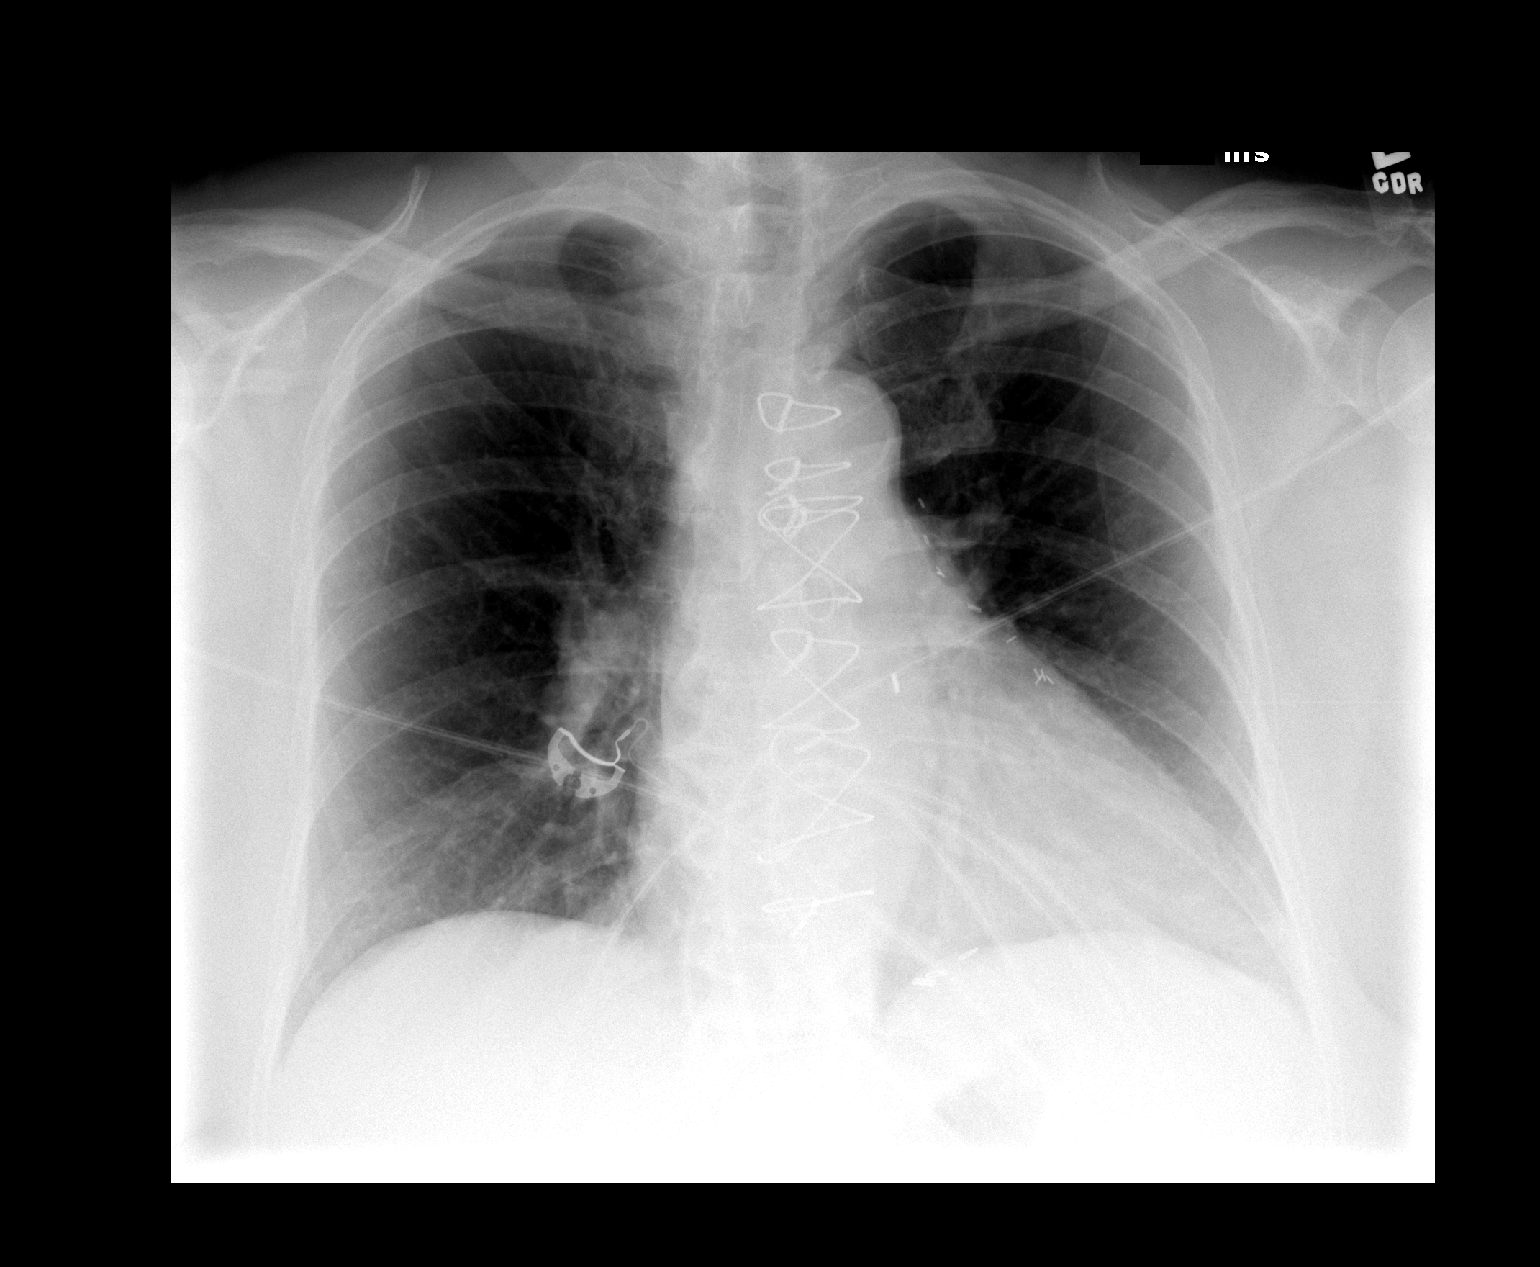

[1 of 1 positions shown; findings below may reference images not displayed]

FINDINGS: Sternotomy for CABG.  Stable mild enlargement of the
cardiac silhouette.  Thoracic aorta tortuous and atherosclerotic.
Hilar and mediastinal contours otherwise unremarkable.  Lungs
clear.  Pulmonary vascularity normal.  No visible pleural
effusions.
IMPRESSION: Stable cardiomegaly.  No acute cardiopulmonary disease.

## 2012-05-10 ENCOUNTER — Other Ambulatory Visit: Payer: Self-pay | Admitting: Family Medicine

## 2012-06-07 ENCOUNTER — Other Ambulatory Visit: Payer: Self-pay | Admitting: Family Medicine

## 2012-06-07 ENCOUNTER — Encounter: Payer: Self-pay | Admitting: *Deleted

## 2012-06-07 DIAGNOSIS — E119 Type 2 diabetes mellitus without complications: Secondary | ICD-10-CM

## 2012-06-07 NOTE — Telephone Encounter (Signed)
This encounter was created in error - please disregard.

## 2012-07-19 ENCOUNTER — Other Ambulatory Visit: Payer: Self-pay | Admitting: Family Medicine

## 2012-09-01 ENCOUNTER — Other Ambulatory Visit: Payer: Self-pay | Admitting: Family Medicine

## 2012-09-09 ENCOUNTER — Other Ambulatory Visit: Payer: Self-pay

## 2012-09-21 ENCOUNTER — Other Ambulatory Visit: Payer: Self-pay

## 2012-09-21 DIAGNOSIS — Z1231 Encounter for screening mammogram for malignant neoplasm of breast: Secondary | ICD-10-CM

## 2012-09-27 ENCOUNTER — Encounter: Payer: Medicare Other | Admitting: Family Medicine

## 2012-09-29 ENCOUNTER — Other Ambulatory Visit: Payer: Self-pay | Admitting: Family Medicine

## 2012-11-07 ENCOUNTER — Other Ambulatory Visit: Payer: Self-pay | Admitting: Family Medicine

## 2012-11-18 ENCOUNTER — Other Ambulatory Visit: Payer: Self-pay | Admitting: Family Medicine

## 2012-12-06 ENCOUNTER — Ambulatory Visit (INDEPENDENT_AMBULATORY_CARE_PROVIDER_SITE_OTHER): Payer: Medicare Other | Admitting: Family Medicine

## 2012-12-06 ENCOUNTER — Encounter: Payer: Self-pay | Admitting: Family Medicine

## 2012-12-06 VITALS — BP 146/72 | HR 77 | Ht 64.5 in | Wt 217.0 lb

## 2012-12-06 DIAGNOSIS — I2510 Atherosclerotic heart disease of native coronary artery without angina pectoris: Secondary | ICD-10-CM

## 2012-12-06 DIAGNOSIS — Z23 Encounter for immunization: Secondary | ICD-10-CM

## 2012-12-06 DIAGNOSIS — I10 Essential (primary) hypertension: Secondary | ICD-10-CM

## 2012-12-06 DIAGNOSIS — E119 Type 2 diabetes mellitus without complications: Secondary | ICD-10-CM

## 2012-12-06 DIAGNOSIS — E782 Mixed hyperlipidemia: Secondary | ICD-10-CM

## 2012-12-06 LAB — CBC
Hemoglobin: 14.5 g/dL (ref 12.0–15.0)
MCH: 30.2 pg (ref 26.0–34.0)
MCHC: 34.5 g/dL (ref 30.0–36.0)
MCV: 87.5 fL (ref 78.0–100.0)
Platelets: 279 10*3/uL (ref 150–400)
RBC: 4.8 MIL/uL (ref 3.87–5.11)

## 2012-12-06 LAB — LIPID PANEL
Cholesterol: 138 mg/dL (ref 0–200)
Total CHOL/HDL Ratio: 3.1 Ratio
VLDL: 21 mg/dL (ref 0–40)

## 2012-12-06 LAB — COMPREHENSIVE METABOLIC PANEL
ALT: 16 U/L (ref 0–35)
CO2: 25 mEq/L (ref 19–32)
Calcium: 8.8 mg/dL (ref 8.4–10.5)
Chloride: 105 mEq/L (ref 96–112)
Creat: 0.56 mg/dL (ref 0.50–1.10)
Glucose, Bld: 163 mg/dL — ABNORMAL HIGH (ref 70–99)
Total Protein: 6.7 g/dL (ref 6.0–8.3)

## 2012-12-06 LAB — POCT GLYCOSYLATED HEMOGLOBIN (HGB A1C): Hemoglobin A1C: 7.4

## 2012-12-06 MED ORDER — METFORMIN HCL 500 MG PO TABS: 1000.0000 mg | ORAL_TABLET | Freq: Two times a day (BID) | ORAL | Status: AC

## 2012-12-06 MED ORDER — PAROXETINE HCL 20 MG PO TABS: 20.0000 mg | ORAL_TABLET | ORAL | Status: DC

## 2012-12-06 MED ORDER — EZETIMIBE 10 MG PO TABS: 10.0000 mg | ORAL_TABLET | Freq: Every day | ORAL | Status: DC

## 2012-12-06 MED ORDER — ATORVASTATIN CALCIUM 10 MG PO TABS: 10.0000 mg | ORAL_TABLET | Freq: Every day | ORAL | Status: DC

## 2012-12-06 MED ORDER — ZOSTER VACCINE LIVE 19400 UNT/0.65ML ~~LOC~~ SOLR: 0.6500 mL | Freq: Once | SUBCUTANEOUS | Status: DC

## 2012-12-06 MED ORDER — AMLODIPINE BESYLATE 5 MG PO TABS: 5.0000 mg | ORAL_TABLET | Freq: Every day | ORAL | Status: DC

## 2012-12-06 MED ORDER — INSULIN GLARGINE 100 UNIT/ML SOLOSTAR PEN: 17.0000 [IU] | PEN_INJECTOR | Freq: Every day | SUBCUTANEOUS | Status: AC

## 2012-12-06 MED ORDER — CLOPIDOGREL BISULFATE 75 MG PO TABS: 75.0000 mg | ORAL_TABLET | Freq: Every day | ORAL | Status: DC

## 2012-12-06 MED ORDER — METOPROLOL SUCCINATE ER 25 MG PO TB24: 25.0000 mg | ORAL_TABLET | Freq: Every day | ORAL | Status: DC

## 2012-12-06 NOTE — Patient Instructions (Addendum)
Good life in [place]  I will call you if your tests are not good.  Otherwise I will send you a letter.  If you do not hear from me with in 2 weeks please call our office.     Exercise regularly   Take the metformin regularly

## 2012-12-06 NOTE — Progress Notes (Signed)
Diabetic Foot Check -  Appearance - no lesions, ulcers or calluses, left ft 5th toenail fell off Skin - no unusual pallor or redness Monofilament testing -  Right - Great toe, medial, central, lateral ball and posterior foot intact Left - Great toe, medial, central, lateral ball and posterior foot intact

## 2012-12-06 NOTE — Progress Notes (Signed)
  Subjective:    Patient ID: Casey [name], female    DOB: [birth year], 72 y.o.   MRN: [id number]  HPI  Feels well  HYPERTENSION Disease Monitoring: Blood pressure range-usually controlled when she checks Chest pain, palpitations- no      Dyspnea- no Medications: Compliance- regularly  Lightheadedness,Syncope- no   Edema- no  DIABETES Disease Monitoring: Blood Sugar ranges-usually less than 180 Polyuria/phagia/dipsia- no      Visual problems- no Medications: Compliance- takes metformin irregularly Hypoglycemic symptoms- no  HYPERLIPIDEMIA Disease Monitoring: See symptoms for Hypertension Medications: Compliance- daily lipitor and zetia Right upper quadrant pain- no  Muscle aches- no  ROS See HPI above   PMH Smoking Status noted  Lab Review   Potassium  Date Value Range Status  12/06/2012 3.9  3.5 - 5.3 mEq/L Final     Sodium  Date Value Range Status  12/06/2012 139  135 - 145 mEq/L Final     Creat  Date Value Range Status  12/06/2012 0.56  0.50 - 1.10 mg/dL Final     Creatinine, Ser  Date Value Range Status  03/07/2011 0.48* 0.50 - 1.10 mg/dL Final      Review of Symptoms - see HPI  PMH - Smoking status noted.  Does not exercise regularly     Review of Systems     Objective:   Physical Exam Alert no acute distress Neck:  No deformities, thyromegaly, masses, or tenderness noted.   Supple with full range of motion without pain. Heart - Regular rate and rhythm.  No murmurs, gallops or rubs.    Lungs:  Normal respiratory effort, chest expands symmetrically. Lungs are clear to auscultation, no crackles or wheezes. Abdomen: soft and non-tender without masses, organomegaly or hernias noted.  No guarding or rebound Extremities:  No cyanosis, edema, or deformity noted with good range of motion of all major joints.   Skin:  Intact without suspicious lesions or rashes        Assessment & Plan:

## 2012-12-07 ENCOUNTER — Encounter: Payer: Self-pay | Admitting: Family Medicine

## 2012-12-07 ENCOUNTER — Ambulatory Visit: Payer: Medicare Other

## 2012-12-07 NOTE — Assessment & Plan Note (Signed)
Good control

## 2012-12-07 NOTE — Assessment & Plan Note (Signed)
Tolerating medications - check labs

## 2012-12-07 NOTE — Assessment & Plan Note (Signed)
Controlled check labs 

## 2013-01-03 ENCOUNTER — Ambulatory Visit: Payer: Medicare Other | Admitting: Family Medicine

## 2013-01-18 ENCOUNTER — Ambulatory Visit: Payer: Medicare Other | Admitting: Home Health Services

## 2013-04-24 ENCOUNTER — Other Ambulatory Visit: Payer: Self-pay | Admitting: Family Medicine

## 2013-06-30 IMAGING — CR DG CHEST 1V PORT
1 series · 1 of 1 positions shown · non-contrast
Comparison: 12/21/2009

CLINICAL DATA: left chest pain

PORTABLE CHEST - 1 VIEW

[view not recorded]
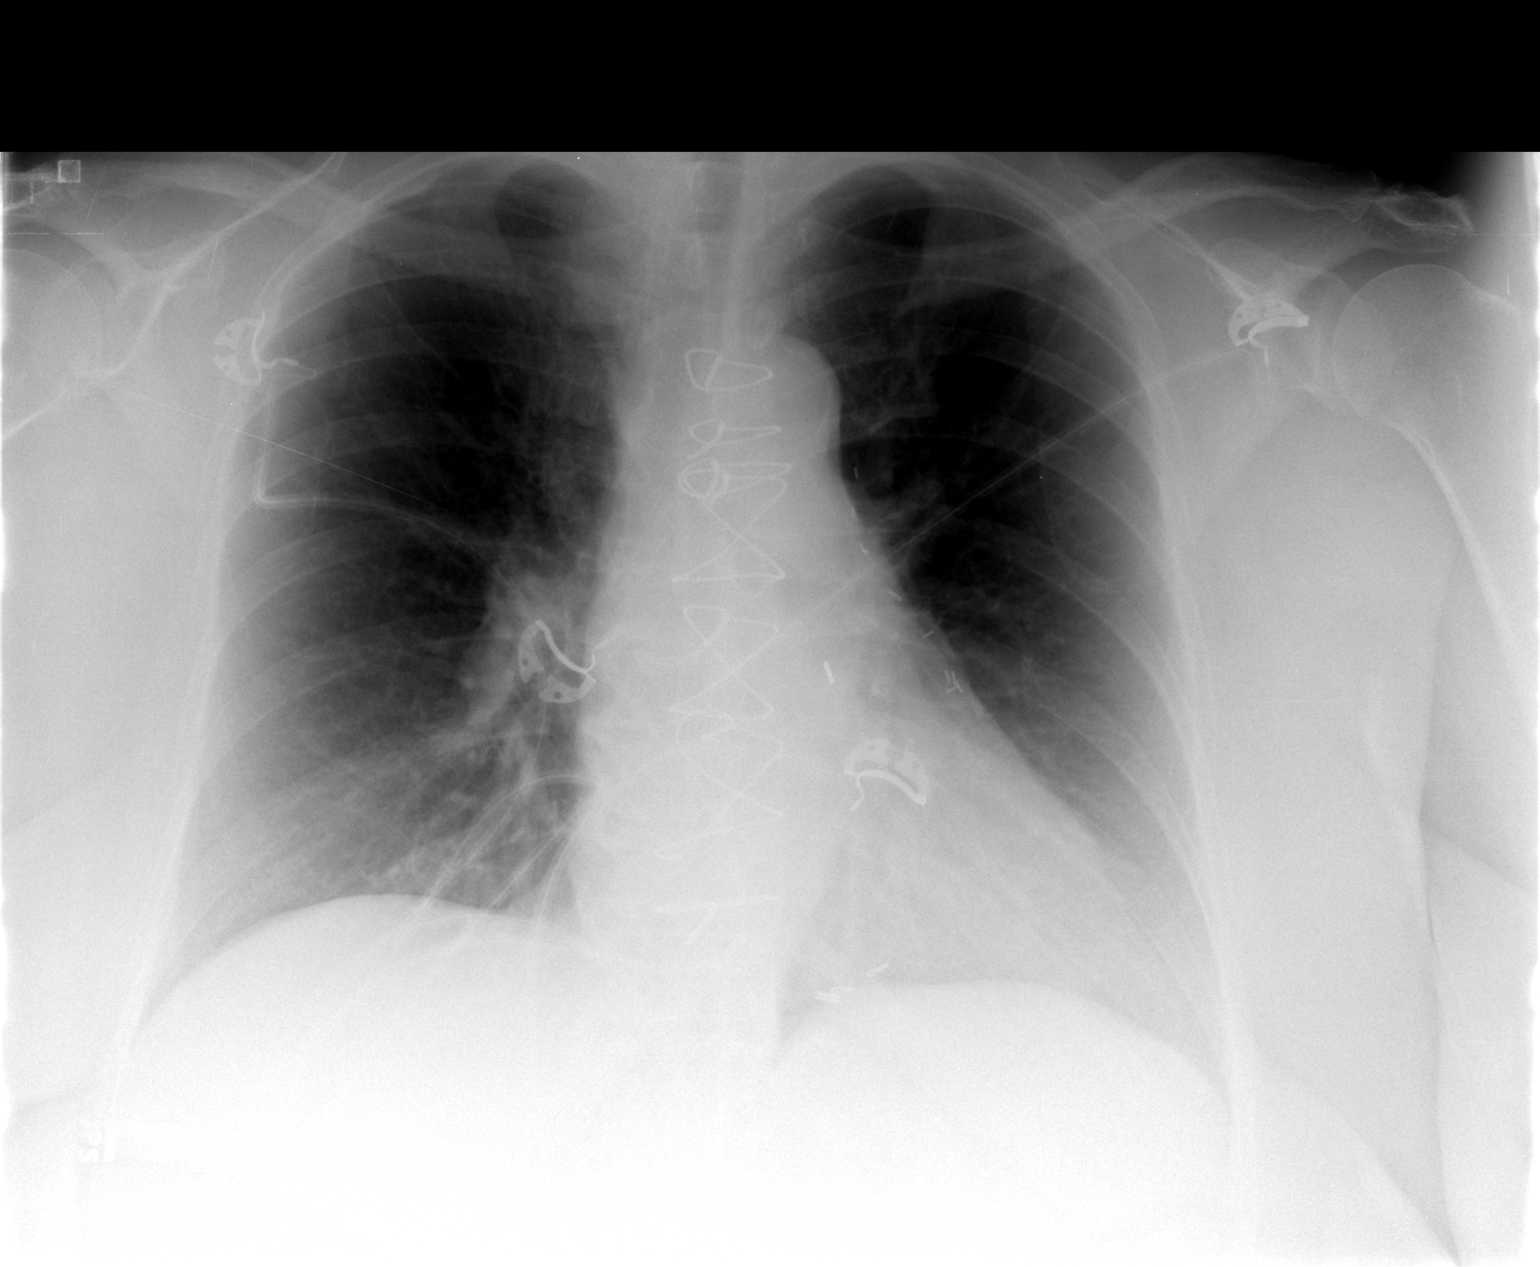

[1 of 1 positions shown; findings below may reference images not displayed]

FINDINGS: Cardiomediastinal silhouette is stable.  Status post
median sternotomy again noted.  No acute infiltrate or pleural
effusion.  No pulmonary edema.
IMPRESSION: No active disease.  No significant change.

## 2013-11-30 ENCOUNTER — Telehealth: Payer: Self-pay | Admitting: Home Health Services

## 2013-11-30 NOTE — Telephone Encounter (Signed)
Spoke with Casey [name]- time to schedule annual exam.  Pt said she will need to schedule a flight and call us back to schedule an appointment.  Pt requested refill on all medications.  Per Dr. Erin Hearing, requested that she contact her pharmacy and request specific medications to send to Dr. Erin Hearing.  Dr will refill a 30 day until she can get an appointment to be seen in office.

## 2013-12-25 ENCOUNTER — Other Ambulatory Visit: Payer: Self-pay | Admitting: Family Medicine

## 2013-12-26 ENCOUNTER — Other Ambulatory Visit: Payer: Self-pay | Admitting: *Deleted

## 2013-12-26 MED ORDER — CLOPIDOGREL BISULFATE 75 MG PO TABS: 75.0000 mg | ORAL_TABLET | Freq: Every day | ORAL | Status: AC

## 2013-12-26 MED ORDER — AMLODIPINE BESYLATE 5 MG PO TABS: 5.0000 mg | ORAL_TABLET | Freq: Every day | ORAL | Status: AC

## 2013-12-28 ENCOUNTER — Other Ambulatory Visit: Payer: Self-pay | Admitting: *Deleted

## 2013-12-28 MED ORDER — PAROXETINE HCL 20 MG PO TABS: 20.0000 mg | ORAL_TABLET | Freq: Every day | ORAL | Status: AC

## 2014-02-09 ENCOUNTER — Encounter (HOSPITAL_COMMUNITY): Payer: Self-pay | Admitting: Cardiology

## 2014-02-14 ENCOUNTER — Telehealth: Payer: Self-pay | Admitting: Family Medicine

## 2014-02-14 DIAGNOSIS — E119 Type 2 diabetes mellitus without complications: Secondary | ICD-10-CM

## 2014-02-14 DIAGNOSIS — E782 Mixed hyperlipidemia: Secondary | ICD-10-CM

## 2014-02-14 NOTE — Telephone Encounter (Signed)
Pt called because she is coming into town on the 20th of December and made an appointment 12/22. She would like the doctor to put in orders for lab work on the 21st so that he can review this at her visit. Blima Rich

## 2014-02-14 NOTE — Telephone Encounter (Signed)
done

## 2014-02-14 NOTE — Telephone Encounter (Signed)
Will forward to MD to place future orders. Jazmin Hartsell,CMA

## 2014-02-15 NOTE — Telephone Encounter (Signed)
LMOVM for pt to return call. Please schedule her a lab visit when she returns call.Fleeger, Salome Spotted

## 2014-02-19 ENCOUNTER — Other Ambulatory Visit: Payer: Self-pay | Admitting: Family Medicine

## 2014-02-21 ENCOUNTER — Ambulatory Visit: Payer: Medicare Other | Admitting: Family Medicine

## 2014-03-09 ENCOUNTER — Other Ambulatory Visit: Payer: Self-pay | Admitting: Family Medicine

## 2014-04-05 ENCOUNTER — Other Ambulatory Visit: Payer: Self-pay | Admitting: Family Medicine

## 2014-06-15 ENCOUNTER — Other Ambulatory Visit: Payer: Self-pay | Admitting: Family Medicine

## 2014-08-22 ENCOUNTER — Other Ambulatory Visit: Payer: Self-pay | Admitting: Family Medicine

## 2014-09-13 ENCOUNTER — Encounter: Payer: Self-pay | Admitting: Family Medicine

## 2014-09-13 ENCOUNTER — Ambulatory Visit (INDEPENDENT_AMBULATORY_CARE_PROVIDER_SITE_OTHER): Payer: Medicare Other | Admitting: Family Medicine

## 2014-09-13 VITALS — BP 133/65 | HR 66 | Temp 97.9°F | Ht 65.0 in | Wt 211.2 lb

## 2014-09-13 DIAGNOSIS — E119 Type 2 diabetes mellitus without complications: Secondary | ICD-10-CM

## 2014-09-13 DIAGNOSIS — E782 Mixed hyperlipidemia: Secondary | ICD-10-CM | POA: Diagnosis not present

## 2014-09-13 DIAGNOSIS — I10 Essential (primary) hypertension: Secondary | ICD-10-CM

## 2014-09-13 LAB — COMPREHENSIVE METABOLIC PANEL
ALK PHOS: 61 U/L (ref 39–117)
ALT: 16 U/L (ref 0–35)
AST: 14 U/L (ref 0–37)
Albumin: 3.8 g/dL (ref 3.5–5.2)
BILIRUBIN TOTAL: 1 mg/dL (ref 0.2–1.2)
BUN: 15 mg/dL (ref 6–23)
CALCIUM: 8.6 mg/dL (ref 8.4–10.5)
CHLORIDE: 102 meq/L (ref 96–112)
CO2: 25 mEq/L (ref 19–32)
Creat: 0.61 mg/dL (ref 0.50–1.10)
Glucose, Bld: 195 mg/dL — ABNORMAL HIGH (ref 70–99)
Potassium: 4.3 mEq/L (ref 3.5–5.3)
Sodium: 138 mEq/L (ref 135–145)
TOTAL PROTEIN: 6.4 g/dL (ref 6.0–8.3)

## 2014-09-13 LAB — POCT GLYCOSYLATED HEMOGLOBIN (HGB A1C): Hemoglobin A1C: 7.9

## 2014-09-13 LAB — LIPID PANEL
CHOL/HDL RATIO: 4.2 ratio
Cholesterol: 186 mg/dL (ref 0–200)
HDL: 44 mg/dL — AB (ref >=46)
LDL CALC: 115 mg/dL — AB (ref 0–99)
TRIGLYCERIDES: 135 mg/dL (ref <150)
VLDL: 27 mg/dL (ref 0–40)

## 2014-09-13 MED ORDER — ATORVASTATIN CALCIUM 10 MG PO TABS: 10.0000 mg | ORAL_TABLET | Freq: Every day | ORAL | Status: AC

## 2014-09-13 NOTE — Patient Instructions (Addendum)
Diabetes mellitus Increase your Lantus Insulin to 20 units every day  Check your fasting blood sugar when your first wake up - if it is regularly > 150 go up by one unit of insulin until it regularly < 150   Blood pressure Is good today I would stop the metorpolol TARTRATE and continue the Metoprolol SUCCINATE once a day  You shuld check your blood pressure - goal is usually less than 140/90 If it is above this then call me or see your regular doctor If you start having chest pain then go to the ER   Anxiety Continue the Paxil 40 mg daily  Heart Disease REstart your lipitor 10 mg daily You should have a cholesterol check in 3 months  Good to see you today!  Thanks for coming in.

## 2014-09-14 NOTE — Assessment & Plan Note (Signed)
At goal today.  Stop one of the two metoprolols she was taking.  Monitor blood pressure

## 2014-09-14 NOTE — Assessment & Plan Note (Signed)
Unsure of control.  Need to restart statin.

## 2014-09-14 NOTE — Progress Notes (Signed)
   Subjective:    Patient ID: Casey [name], female    DOB: August 23, 1940, 74 y.o.   MRN: [id number]  HPI  Currently living in [place].  Not seen here almost 2 years.  Has been seeing physicians there.  No satisfied with current care would like a check up of chronic conditions  HYPERTENSION Disease Monitoring: Blood pressure range-not checking Chest pain, palpitations- no      Dyspnea- no Medications: Compliance- takng 2 forms of metoprolol Lightheadedness,Syncope- no   Edema- no  DIABETES Disease Monitoring: Blood Sugar ranges-rarely usually in 150-200 Polyuria/phagia/dipsia- no      Visual problems- no Medications: Compliance- daily insulin although had been instructed to take twice daily  Hypoglycemic symptoms- no  HYPERLIPIDEMIA Disease Monitoring: See symptoms for Hypertension Medications: Compliance- not taking lipitor does not hve bottle unsure how long not taking is taking Zetia Right upper quadrant pain- no  Muscle aches- no  Monitoring Labs and Parameters Last A1C:  Lab Results  Component Value Date   HGBA1C 7.9 09/13/2014    Last Lipid:     Component Value Date/Time   CHOL 186 09/13/2014 0919   HDL 44* 09/13/2014 0919    Last Bmet  POTASSIUM  Date Value Ref Range Status  09/13/2014 4.3 3.5 - 5.3 mEq/L Final   SODIUM  Date Value Ref Range Status  09/13/2014 138 135 - 145 mEq/L Final   CREAT  Date Value Ref Range Status  09/13/2014 0.61 0.50 - 1.10 mg/dL Final   CREATININE, SER  Date Value Ref Range Status  03/07/2011 0.48* 0.50 - 1.10 mg/dL Final      Last BPs:  BP Readings from Last 3 Encounters:  09/13/14 133/65  12/06/12 146/72  11/07/11 112/52    Chief Complaint noted Review of Symptoms - see HPI PMH - Smoking status noted.   Vital Signs reviewed     Review of Systems     Objective:   Physical Exam  Alert nad Heart - Regular rate and rhythm.  No murmurs, gallops or rubs.    Lungs:  Normal respiratory effort, chest expands  symmetrically. Lungs are clear to auscultation, no crackles or wheezes. Extrem - no pitting edema no lesions       Assessment & Plan:

## 2014-09-14 NOTE — Assessment & Plan Note (Signed)
Not at goal increase insulin - see AVS

## 2014-09-15 ENCOUNTER — Encounter: Payer: Self-pay | Admitting: Family Medicine

## 2014-12-20 ENCOUNTER — Other Ambulatory Visit: Payer: Self-pay | Admitting: Family Medicine

## 2015-10-30 ENCOUNTER — Other Ambulatory Visit: Payer: Self-pay
